# Patient Record
Sex: Female | Born: 1960
Health system: Southern US, Community
[De-identification: ages and names within clinical notes are randomized; demographics above are authoritative.]

## PROBLEM LIST (undated history)

## (undated) DIAGNOSIS — G4700 Insomnia, unspecified: Secondary | ICD-10-CM

## (undated) DIAGNOSIS — F319 Bipolar disorder, unspecified: Secondary | ICD-10-CM

## (undated) DIAGNOSIS — K5730 Diverticulosis of large intestine without perforation or abscess without bleeding: Secondary | ICD-10-CM

## (undated) HISTORY — PX: TUBAL LIGATION: SHX77

## (undated) HISTORY — DX: Diverticulosis of large intestine without perforation or abscess without bleeding: K57.30

## (undated) HISTORY — DX: Insomnia, unspecified: G47.00

## (undated) HISTORY — DX: Bipolar disorder, unspecified: F31.9

## (undated) HISTORY — PX: APPENDECTOMY: SHX54

---

## 1989-10-06 DIAGNOSIS — F319 Bipolar disorder, unspecified: Secondary | ICD-10-CM

## 1989-10-06 HISTORY — DX: Bipolar disorder, unspecified: F31.9

## 2002-12-13 ENCOUNTER — Emergency Department (HOSPITAL_COMMUNITY): Admission: EM | Admit: 2002-12-13 | Discharge: 2002-12-13 | Payer: Self-pay | Admitting: *Deleted

## 2003-04-04 ENCOUNTER — Emergency Department (HOSPITAL_COMMUNITY): Admission: EM | Admit: 2003-04-04 | Discharge: 2003-04-04 | Payer: Self-pay | Admitting: Emergency Medicine

## 2008-04-26 ENCOUNTER — Ambulatory Visit: Payer: Self-pay | Admitting: Gastroenterology

## 2008-04-26 DIAGNOSIS — K219 Gastro-esophageal reflux disease without esophagitis: Secondary | ICD-10-CM | POA: Insufficient documentation

## 2008-04-26 DIAGNOSIS — K644 Residual hemorrhoidal skin tags: Secondary | ICD-10-CM

## 2014-07-24 ENCOUNTER — Ambulatory Visit (INDEPENDENT_AMBULATORY_CARE_PROVIDER_SITE_OTHER): Payer: Self-pay | Admitting: Gastroenterology

## 2014-07-24 ENCOUNTER — Other Ambulatory Visit (INDEPENDENT_AMBULATORY_CARE_PROVIDER_SITE_OTHER): Payer: Self-pay

## 2014-07-24 ENCOUNTER — Encounter: Payer: Self-pay | Admitting: Gastroenterology

## 2014-07-24 VITALS — BP 124/82 | HR 76 | Ht 64.5 in | Wt 126.4 lb

## 2014-07-24 DIAGNOSIS — G8929 Other chronic pain: Secondary | ICD-10-CM

## 2014-07-24 DIAGNOSIS — R1013 Epigastric pain: Secondary | ICD-10-CM

## 2014-07-24 DIAGNOSIS — K625 Hemorrhage of anus and rectum: Secondary | ICD-10-CM

## 2014-07-24 DIAGNOSIS — K219 Gastro-esophageal reflux disease without esophagitis: Secondary | ICD-10-CM

## 2014-07-24 LAB — CBC WITH DIFFERENTIAL/PLATELET
BASOS ABS: 0 10*3/uL (ref 0.0–0.1)
BASOS PCT: 0.6 % (ref 0.0–3.0)
Eosinophils Absolute: 0.2 10*3/uL (ref 0.0–0.7)
Eosinophils Relative: 2.5 % (ref 0.0–5.0)
HCT: 45.4 % (ref 36.0–46.0)
Hemoglobin: 14.9 g/dL (ref 12.0–15.0)
Lymphocytes Relative: 29.9 % (ref 12.0–46.0)
Lymphs Abs: 2.2 10*3/uL (ref 0.7–4.0)
MCHC: 32.8 g/dL (ref 30.0–36.0)
MCV: 90.8 fl (ref 78.0–100.0)
MONO ABS: 0.5 10*3/uL (ref 0.1–1.0)
Monocytes Relative: 6.4 % (ref 3.0–12.0)
NEUTROS PCT: 60.6 % (ref 43.0–77.0)
Neutro Abs: 4.5 10*3/uL (ref 1.4–7.7)
PLATELETS: 232 10*3/uL (ref 150.0–400.0)
RBC: 5 Mil/uL (ref 3.87–5.11)
RDW: 13.6 % (ref 11.5–15.5)
WBC: 7.5 10*3/uL (ref 4.0–10.5)

## 2014-07-24 LAB — COMPREHENSIVE METABOLIC PANEL
ALT: 16 U/L (ref 0–35)
AST: 18 U/L (ref 0–37)
Albumin: 4.3 g/dL (ref 3.5–5.2)
Alkaline Phosphatase: 44 U/L (ref 39–117)
BUN: 26 mg/dL — AB (ref 6–23)
CO2: 22 mEq/L (ref 19–32)
CREATININE: 0.8 mg/dL (ref 0.4–1.2)
Calcium: 9.6 mg/dL (ref 8.4–10.5)
Chloride: 100 mEq/L (ref 96–112)
GFR: 78.04 mL/min (ref >60.00)
Glucose, Bld: 101 mg/dL — ABNORMAL HIGH (ref 70–99)
Potassium: 3.8 mEq/L (ref 3.5–5.1)
Sodium: 138 mEq/L (ref 135–145)
Total Bilirubin: 0.6 mg/dL (ref 0.2–1.2)
Total Protein: 8.2 g/dL (ref 6.0–8.3)

## 2014-07-24 MED ORDER — ESOMEPRAZOLE MAGNESIUM 40 MG PO CPDR: DELAYED_RELEASE_CAPSULE | ORAL | Status: DC

## 2014-07-24 NOTE — Progress Notes (Signed)
_                                                                                                                History of Present Illness:  [name] is a 53 year old white female self-referred for evaluation of abdominal pain and rectal bleeding.  She's had pyrosis for years for which she has taken over-the-counter Prevacid with partial relief.  Despite this she has frequent pyrosis, postprandial fullness with early satiety, and upper abdominal discomfort.  She had been taking Aleve daily but stopped this 3 weeks ago.  She's had occasional nausea with vomiting.  Paragraph patient also complains of very frequent rectal bleeding.  She may see blood mixed with her stools and frequently would lead to her underclothes.  She has hemorrhoids for which she is taking various topicals for months.  She moves her bowels regularly.  She has been taking hyomax with partial relief of abdominal pain.   History reviewed. No pertinent past medical history. Past Surgical History  Procedure Laterality Date  . Tubal ligation    . Appendectomy     family history includes Diabetes in her father; Heart attack in her mother; Lung cancer in her father. There is no history of Colon cancer, Colon polyps, or Kidney disease. Current Outpatient Prescriptions  Medication Sig Dispense Refill  . hydrocortisone (ANUSOL-HC) 25 MG suppository Place 25 mg rectally 2 (two) times daily.      . hyoscyamine (LEVBID) 0.375 MG 12 hr tablet Take 0.375 mg by mouth 2 (two) times daily.       No current facility-administered medications for this visit.   Allergies as of 07/24/2014 - Review Complete 07/24/2014  Allergen Reaction Noted  . Codeine Itching, Nausea Only, and Other (See Comments) 07/24/2014  . Penicillins Itching, Nausea Only, and Other (See Comments) 07/24/2014    reports that she quit smoking about 6 years ago. Her smoking use included Cigarettes. She smoked 0.00 packs per day. She has never used  smokeless tobacco. She reports that she drinks alcohol. She reports that she uses illicit drugs (Marijuana).   Review of Systems: Pertinent positive and negative review of systems were noted in the above HPI section. All other review of systems were otherwise negative.  Vital signs were reviewed in today's medical record Physical Exam: General: Well developed , well nourished, no acute distress Skin: anicteric Head: Normocephalic and atraumatic Eyes:  sclerae anicteric, EOMI Ears: Normal auditory acuity Mouth: No deformity or lesions Neck: Supple, no masses or thyromegaly Lungs: Clear throughout to auscultation Heart: Regular rate and rhythm; no murmurs, rubs or bruits Abdomen: Soft, non tender and non distended. No masses, hepatosplenomegaly or hernias noted. Normal Bowel sounds Rectal: Grade 3 hemorrhoids and skin tags Musculoskeletal: Symmetrical with no gross deformities  Skin: No lesions on visible extremities Pulses:  Normal pulses noted Extremities: No clubbing, cyanosis, edema or deformities noted Neurological: Alert oriented x 4, grossly nonfocal Cervical Nodes:  No significant cervical adenopathy Inguinal Nodes: No significant inguinal adenopathy Psychological:  Alert  and cooperative. Normal mood and affect  See Assessment and Plan under Problem List

## 2014-07-24 NOTE — Assessment & Plan Note (Signed)
A she clearly is having hemorrhoidal bleeding.  She has grade 3 hemorrhoids.  A more proximal colonic bleeding source should be ruled out as well.  Recommendations #1 colonoscopy #2 to consider band ligation of hemorrhoids pending results of colonoscopy

## 2014-07-24 NOTE — Assessment & Plan Note (Signed)
Persistent upper abdominal pain postprandially may be due to ulcer or nonulcer dyspepsia.  NSAID use may certainly be contributing.   Recommendations #1 avoid NSAIDs #2 Nexium #3 if symptoms do not improve in the next 2 weeks would proceed with upper endoscopy

## 2014-07-24 NOTE — Patient Instructions (Signed)
Hemorrhoids  Hemorrhoids are swollen veins around the rectum or anus. There are two types of hemorrhoids:    Internal hemorrhoids. These occur in the veins just inside the rectum. They may poke through to the outside and become irritated and painful.   External hemorrhoids. These occur in the veins outside the anus and can be felt as a painful swelling or hard lump near the anus.  CAUSES   Pregnancy.    Obesity.    Constipation or diarrhea.    Straining to have a bowel movement.    Sitting for long periods on the toilet.   Heavy lifting or other activity that caused you to strain.   Anal intercourse.  SYMPTOMS    Pain.    Anal itching or irritation.    Rectal bleeding.    Fecal leakage.    Anal swelling.    One or more lumps around the anus.   DIAGNOSIS   Your caregiver may be able to diagnose hemorrhoids by visual examination. Other examinations or tests that may be performed include:    Examination of the rectal area with a gloved hand (digital rectal exam).    Examination of anal canal using a small tube (scope).    A blood test if you have lost a significant amount of blood.   A test to look inside the colon (sigmoidoscopy or colonoscopy).  TREATMENT  Most hemorrhoids can be treated at home. However, if symptoms do not seem to be getting better or if you have a lot of rectal bleeding, your caregiver may perform a procedure to help make the hemorrhoids get smaller or remove them completely. Possible treatments include:    Placing a rubber band at the base of the hemorrhoid to cut off the circulation (rubber band ligation).    Injecting a chemical to shrink the hemorrhoid (sclerotherapy).    Using a tool to burn the hemorrhoid (infrared light therapy).    Surgically removing the hemorrhoid (hemorrhoidectomy).    Stapling the hemorrhoid to block blood flow to the tissue (hemorrhoid stapling).   HOME CARE INSTRUCTIONS    Eat foods with fiber, such as whole grains, beans,  nuts, fruits, and vegetables. Ask your doctor about taking products with added fiber in them (fibersupplements).   Increase fluid intake. Drink enough water and fluids to keep your urine clear or pale yellow.    Exercise regularly.    Go to the bathroom when you have the urge to have a bowel movement. Do not wait.    Avoid straining to have bowel movements.    Keep the anal area dry and clean. Use wet toilet paper or moist towelettes after a bowel movement.    Medicated creams and suppositories may be used or applied as directed.    Only take over-the-counter or prescription medicines as directed by your caregiver.    Take warm sitz baths for 15-20 minutes, 3-4 times a day to ease pain and discomfort.    Place ice packs on the hemorrhoids if they are tender and swollen. Using ice packs between sitz baths may be helpful.     Put ice in a plastic bag.     Place a towel between your skin and the bag.     Leave the ice on for 15-20 minutes, 3-4 times a day.    Do not use a donut-shaped pillow or sit on the toilet for long periods. This increases blood pooling and pain.   SEEK MEDICAL CARE IF:     Will watch your condition.  Will get help right away if you are not doing well or get worse. Document Released: 09/19/2000 Document Revised: 09/08/2012 Document Reviewed: 07/27/2012 [facility] Patient Information 2015 [place], [place]. This information is not intended to replace advice given to you by your health care provider. Make sure you discuss any questions you have with your health care provider.   You have been scheduled for a colonoscopy. Please follow written instructions given to you  at your visit today.  Please pick up your prep kit at the pharmacy within the next 1-3 days. If you use inhalers (even only as needed), please bring them with you on the day of your procedure. Your physician has requested that you go to www.startemmi.com and enter the access code given to you at your visit today. This web site gives a general overview about your procedure. However, you should still follow specific instructions given to you by our office regarding your preparation for the procedure.  Go to the basement for labs today Brochure for Hemorrhoidal banding given

## 2014-07-24 NOTE — Assessment & Plan Note (Signed)
Patient's very symptomatic with GERD.  This may also be responsible for upper abdominal pain.  Recommendations #1 antireflux measures #2 begin Nexium 40 mg daily #3 continue hyomax as needed #4 to consider upper endoscopy pending results of above

## 2014-07-24 NOTE — Addendum Note (Signed)
Addended by: Melvia HeapsKAPLAN, ROBERT D on: 07/24/2014 06:33 PM   Modules accepted: Orders

## 2014-08-21 ENCOUNTER — Telehealth: Payer: Self-pay | Admitting: *Deleted

## 2014-08-21 NOTE — Telephone Encounter (Signed)
===  View-only below this line===  ----- Message -----    From: Louis Meckelobert D Kaplan, MD    Sent: 08/21/2014   9:36 AM      To: Marlowe Kaysobin M Larnell Granlund, CMA  Please schedule 48 hour bravo.  Study with patient holding her acid reflux medications

## 2014-08-23 ENCOUNTER — Telehealth: Payer: Self-pay | Admitting: Gastroenterology

## 2014-08-23 ENCOUNTER — Other Ambulatory Visit: Payer: Self-pay

## 2014-08-23 MED ORDER — PANTOPRAZOLE SODIUM 40 MG PO TBEC: 40.0000 mg | DELAYED_RELEASE_TABLET | Freq: Two times a day (BID) | ORAL | Status: DC

## 2014-08-23 NOTE — Telephone Encounter (Signed)
Switch her to Protonix 40 mg before breakfast and dinner.  She can take antacids as needed.  Also would schedule her for upper endoscopy at the same time of her colonoscopy

## 2014-08-23 NOTE — Telephone Encounter (Signed)
Rx called to [facility] and left on the voicemail. Patient notified.

## 2014-08-23 NOTE — Telephone Encounter (Signed)
Patient advised.

## 2014-08-23 NOTE — Telephone Encounter (Signed)
Patient reports improvement on Nexium, but not complete relief of the symptoms. She would like to try something different. Want to change to a different PPI?

## 2014-08-25 NOTE — Telephone Encounter (Signed)
Dr Rogue JuryKaplan Loretta [facility]   She is not going to want to cancel this appointment

## 2014-08-28 NOTE — Telephone Encounter (Signed)
Dr Arlyce DiceKaplan Does she need an EGD for the BRAVO study that you ordered on her?  I was letting you know that she is already having an EGD/COLON in [facility] on 12/8

## 2014-08-29 NOTE — Telephone Encounter (Signed)
ok 

## 2014-08-29 NOTE — Telephone Encounter (Signed)
If she is still having symptoms despite PPI therapy she should be scheduled for a bravo pH study while holding meds for 24 hours.

## 2014-08-29 NOTE — Telephone Encounter (Signed)
Tried to contact this patient to schedule a [facility]  This requires that she be cancelled [facility] which she will continue with the colonoscopy in [facility] on 12/8 She will need to be rescheduled for [facility]  Unable to contact patient  Dr Arlyce DiceKaplan, Lorain ChildesFYI as of now I am unable to contact this patient to schedule the EGD with BRAVO, she is already scheduled for the EGD with Colon on 12/8 I will continue to try and contact her but will leave her on [facility] as scheduled for now

## 2014-09-05 DIAGNOSIS — K5730 Diverticulosis of large intestine without perforation or abscess without bleeding: Secondary | ICD-10-CM

## 2014-09-05 HISTORY — DX: Diverticulosis of large intestine without perforation or abscess without bleeding: K57.30

## 2014-09-12 ENCOUNTER — Encounter: Payer: Self-pay | Admitting: Gastroenterology

## 2014-09-12 ENCOUNTER — Ambulatory Visit (AMBULATORY_SURGERY_CENTER): Payer: Self-pay | Admitting: Gastroenterology

## 2014-09-12 VITALS — BP 122/84 | HR 62 | Temp 98.5°F | Resp 16 | Ht 64.5 in | Wt 126.0 lb

## 2014-09-12 DIAGNOSIS — K625 Hemorrhage of anus and rectum: Secondary | ICD-10-CM

## 2014-09-12 DIAGNOSIS — R1013 Epigastric pain: Secondary | ICD-10-CM

## 2014-09-12 DIAGNOSIS — K5730 Diverticulosis of large intestine without perforation or abscess without bleeding: Secondary | ICD-10-CM

## 2014-09-12 DIAGNOSIS — K643 Fourth degree hemorrhoids: Secondary | ICD-10-CM

## 2014-09-12 MED ORDER — SODIUM CHLORIDE 0.9 % IV SOLN: 500.0000 mL | INTRAVENOUS | Status: DC

## 2014-09-12 NOTE — Patient Instructions (Signed)
Discharge instructions given. Handouts on diverticulosis and hemorrhoids. Resume previous medications. YOU HAD AN ENDOSCOPIC PROCEDURE TODAY AT THE [facility]: Refer to the procedure report that was given to you for any specific questions about what was found during the examination.  If the procedure report does not answer your questions, please call your gastroenterologist to clarify.  If you requested that your care partner not be given the details of your procedure findings, then the procedure report has been included in a sealed envelope for you to review at your convenience later.  YOU SHOULD EXPECT: Some feelings of bloating in the abdomen. Passage of more gas than usual.  Walking can help get rid of the air that was put into your GI tract during the procedure and reduce the bloating. If you had a lower endoscopy (such as a colonoscopy or flexible sigmoidoscopy) you may notice spotting of blood in your stool or on the toilet paper. If you underwent a bowel prep for your procedure, then you may not have a normal bowel movement for a few days.  DIET: Your first meal following the procedure should be a light meal and then it is ok to progress to your normal diet.  A half-sandwich or bowl of soup is an example of a good first meal.  Heavy or fried foods are harder to digest and may make you feel nauseous or bloated.  Likewise meals heavy in dairy and vegetables can cause extra gas to form and this can also increase the bloating.  Drink plenty of fluids but you should avoid alcoholic beverages for 24 hours.  ACTIVITY: Your care partner should take you home directly after the procedure.  You should plan to take it easy, moving slowly for the rest of the day.  You can resume normal activity the day after the procedure however you should NOT DRIVE or use heavy machinery for 24 hours (because of the sedation medicines used during the test).    SYMPTOMS TO REPORT IMMEDIATELY: A  gastroenterologist can be reached at any hour.  During normal business hours, 8:30 AM to 5:00 PM Monday through Friday, call [phone].  After hours and on weekends, please call the GI answering service at [phone] who will take a message and have the physician on call contact you.   Following lower endoscopy (colonoscopy or flexible sigmoidoscopy):  Excessive amounts of blood in the stool  Significant tenderness or worsening of abdominal pains  Swelling of the abdomen that is new, acute  Fever of 100F or higher  Following upper endoscopy (EGD)  Vomiting of blood or coffee ground material  New chest pain or pain under the shoulder blades  Painful or persistently difficult swallowing  New shortness of breath  Fever of 100F or higher  Black, tarry-looking stools  FOLLOW UP: If any biopsies were taken you will be contacted by phone or by letter within the next 1-3 weeks.  Call your gastroenterologist if you have not heard about the biopsies in 3 weeks.  Our staff will call the home number listed on your records the next business day following your procedure to check on you and address any questions or concerns that you may have at that time regarding the information given to you following your procedure. This is a courtesy call and so if there is no answer at the home number and we have not heard from you through the emergency physician on call, we will assume that you have returned to your  regular daily activities without incident.  SIGNATURES/CONFIDENTIALITY: You and/or your care partner have signed paperwork which will be entered into your electronic medical record.  These signatures attest to the fact that that the information above on your After Visit Summary has been reviewed and is understood.  Full responsibility of the confidentiality of this discharge information lies with you and/or your care-partner.

## 2014-09-12 NOTE — Op Note (Signed)
 [facility] [address] [place], [id number]   ENDOSCOPY PROCEDURE REPORT  PATIENT: Loretta [name], Loretta [name]  MR#: [id number] BIRTHDATE: 1960-10-19 , 53  yrs. old GENDER: female ENDOSCOPIST: Louis Meckelobert [name] Kaplan, MD REFERRED BY: PROCEDURE DATE:  09/12/2014 PROCEDURE:  EGD, diagnostic ASA CLASS:     Class II INDICATIONS:  epigastric pain and heartburn. MEDICATIONS: Residual sedation present, Monitored anesthesia care, and Propofol 70 mg IV TOPICAL ANESTHETIC:  DESCRIPTION OF PROCEDURE: After the risks benefits and alternatives of the procedure were thoroughly explained, informed consent was obtained.  The LB [id number] endoscope was introduced through the mouth and advanced to the second portion of the duodenum , Without limitations.  The instrument was slowly withdrawn as the mucosa was fully examined.      EXAM: The esophagus and gastroesophageal junction were completely normal in appearance.  The stomach was entered and closely examined.The antrum, angularis, and lesser curvature were well visualized, including a retroflexed view of the cardia and fundus. The stomach wall was normally distensable.  The scope passed easily through the pylorus into the duodenum.  Retroflexed views revealed no abnormalities.     The scope was then withdrawn from the patient and the procedure completed.  COMPLICATIONS: There were no immediate complications.  ENDOSCOPIC IMPRESSION: Normal appearing esophagus and GE junction, the stomach was well visualized and normal in appearance, normal appearing duodenum  RECOMMENDATIONS: Trial of dexilant 60mg  1-2x day  REPEAT EXAM:  eSigned:  Louis Meckelobert [name] Kaplan, MD 09/12/2014 9:24 AM    CC:

## 2014-09-12 NOTE — Progress Notes (Signed)
A/ox3 pleased with MAC, report to Celia RN 

## 2014-09-13 ENCOUNTER — Telehealth: Payer: Self-pay

## 2014-09-13 NOTE — Op Note (Signed)
[facility] [address] [place], [id number]   COLONOSCOPY PROCEDURE REPORT  PATIENT: Loretta [name], [name]  MR#: [id number] BIRTHDATE: Sep 06, 1961 , 53  yrs. old GENDER: female ENDOSCOPIST: Louis Meckelobert D Swara Donze, MD REFERRED BY: PROCEDURE DATE:  09/12/2014 PROCEDURE:   Colonoscopy, diagnostic First Screening Colonoscopy - Avg.  risk and is 50 yrs.  old or older Yes.  Prior Negative Screening - Now for repeat screening. N/A  History of Adenoma - Now for follow-up colonoscopy & has been > or = to 3 yrs.  N/A  Polyps Removed Today? No.  Recommend repeat exam, <10 yrs? No. ASA CLASS:   Class II INDICATIONS:first colonoscopy. MEDICATIONS: Monitored anesthesia care and Propofol 330 mg IV  DESCRIPTION OF PROCEDURE:   After the risks benefits and alternatives of the procedure were thoroughly explained, informed consent was obtained.  The digital rectal exam revealed hemorrhoids, Grade IV and revealed several skin tags.   The LB [id number]  endoscope was introduced through the anus and advanced to the   . No adverse events experienced.   The quality of the prep was Suprep good  The instrument was then slowly withdrawn as the colon was fully examined.      COLON FINDINGS: Internal hemorrhoids were found.   There was mild diverticulosis noted in the descending colon.   The examination was otherwise normal.  Retroflexed views revealed no abnormalities. The time to cecum=3 minutes 45 seconds.  Withdrawal time=8 minutes 24 seconds.  The scope was withdrawn and the procedure completed. COMPLICATIONS: There were no immediate complications.  ENDOSCOPIC IMPRESSION: 1.   Internal hemorrhoids 2.   Mild diverticulosis was noted in the descending colon 3.   The examination was otherwise normal  RECOMMENDATIONS: Continue current colorectal screening recommendations for "routine risk" patients with a repeat colonoscopy in 10 years.  eSigned:  Louis Meckelobert D Jaydeen Darley, MD 09/12/2014 9:18  AM   cc:

## 2014-09-13 NOTE — Telephone Encounter (Signed)
  Follow up Call-  Call back number 09/12/2014  Post procedure Call Back phone  # [phone]  Permission to leave phone message Yes     Patient questions:  Do you have a fever, pain , or abdominal swelling? No. Pain Score  0 *  Have you tolerated food without any problems? Yes.    Have you been able to return to your normal activities? Yes.    Do you have any questions about your discharge instructions: Diet   No. Medications  No. Follow up visit  No.  Do you have questions or concerns about your Care? No.  Actions: * If pain score is 4 or above: No action needed, pain <4.

## 2016-05-29 ENCOUNTER — Ambulatory Visit (HOSPITAL_BASED_OUTPATIENT_CLINIC_OR_DEPARTMENT_OTHER): Payer: Self-pay | Admitting: Family Medicine

## 2016-05-29 ENCOUNTER — Encounter: Payer: Self-pay | Admitting: Family Medicine

## 2016-05-29 ENCOUNTER — Ambulatory Visit: Payer: Self-pay | Attending: Internal Medicine

## 2016-05-29 ENCOUNTER — Other Ambulatory Visit: Payer: Self-pay

## 2016-05-29 VITALS — BP 137/89 | HR 78 | Temp 98.5°F | Wt 135.6 lb

## 2016-05-29 DIAGNOSIS — Z124 Encounter for screening for malignant neoplasm of cervix: Secondary | ICD-10-CM | POA: Insufficient documentation

## 2016-05-29 DIAGNOSIS — G4700 Insomnia, unspecified: Secondary | ICD-10-CM | POA: Insufficient documentation

## 2016-05-29 DIAGNOSIS — K644 Residual hemorrhoidal skin tags: Secondary | ICD-10-CM

## 2016-05-29 DIAGNOSIS — K5792 Diverticulitis of intestine, part unspecified, without perforation or abscess without bleeding: Secondary | ICD-10-CM | POA: Insufficient documentation

## 2016-05-29 DIAGNOSIS — Z9851 Tubal ligation status: Secondary | ICD-10-CM | POA: Insufficient documentation

## 2016-05-29 DIAGNOSIS — K648 Other hemorrhoids: Secondary | ICD-10-CM | POA: Insufficient documentation

## 2016-05-29 DIAGNOSIS — F319 Bipolar disorder, unspecified: Secondary | ICD-10-CM | POA: Insufficient documentation

## 2016-05-29 DIAGNOSIS — Z87891 Personal history of nicotine dependence: Secondary | ICD-10-CM | POA: Insufficient documentation

## 2016-05-29 LAB — HEMOCCULT GUIAC POC 1CARD (OFFICE): Fecal Occult Blood, POC: POSITIVE — AB

## 2016-05-29 MED ORDER — HYDROCORTISONE ACE-PRAMOXINE 2.5-1 % RE CREA: 1.0000 "application " | TOPICAL_CREAM | Freq: Three times a day (TID) | RECTAL | 0 refills | Status: DC

## 2016-05-29 MED FILL — HYDROCORT-PRAMOXINE 2.5%-1%: 2.5-1 | 20 days supply | Qty: 30 | Fill #0

## 2016-05-29 NOTE — Patient Instructions (Addendum)
Loretta [name] was seen today for hemorrhoids.  Diagnoses and all orders for this visit:  Pap smear for cervical cancer screening -     Cytology - PAP  External hemorrhoids -     POCT occult blood stool -     hydrocortisone-pramoxine (ANALPRAM-HC) 2.5-1 % rectal cream; Place 1 application rectally 3 (three) times daily. -     Ambulatory referral to General Surgery   Please use analpram as needed. Stop of you develop skin sensitivity or sores  Ultimately the hemorrhoids piles need to be removed, I have placed a general surgery referral. We will work on getting you with a surgeon  You will be called with pap results  F/u in 8 weeks for hemorrhoids   Dr. Armen PickupFunches   Hemorrhoids Hemorrhoids are swollen veins around the rectum or anus. There are two types of hemorrhoids:   Internal hemorrhoids. These occur in the veins just inside the rectum. They may poke through to the outside and become irritated and painful.  External hemorrhoids. These occur in the veins outside the anus and can be felt as a painful swelling or hard lump near the anus. CAUSES  Pregnancy.   Obesity.   Constipation or diarrhea.   Straining to have a bowel movement.   Sitting for long periods on the toilet.  Heavy lifting or other activity that caused you to strain.  Anal intercourse. SYMPTOMS   Pain.   Anal itching or irritation.   Rectal bleeding.   Fecal leakage.   Anal swelling.   One or more lumps around the anus.  DIAGNOSIS  Your caregiver may be able to diagnose hemorrhoids by visual examination. Other examinations or tests that may be performed include:   Examination of the rectal area with a gloved hand (digital rectal exam).   Examination of anal canal using a small tube (scope).   A blood test if you have lost a significant amount of blood.  A test to look inside the colon (sigmoidoscopy or colonoscopy). TREATMENT Most hemorrhoids can be treated at home. However, if  symptoms do not seem to be getting better or if you have a lot of rectal bleeding, your caregiver may perform a procedure to help make the hemorrhoids get smaller or remove them completely. Possible treatments include:   Placing a rubber band at the base of the hemorrhoid to cut off the circulation (rubber band ligation).   Injecting a chemical to shrink the hemorrhoid (sclerotherapy).   Using a tool to burn the hemorrhoid (infrared light therapy).   Surgically removing the hemorrhoid (hemorrhoidectomy).   Stapling the hemorrhoid to block blood flow to the tissue (hemorrhoid stapling).  HOME CARE INSTRUCTIONS   Eat foods with fiber, such as whole grains, beans, nuts, fruits, and vegetables. Ask your doctor about taking products with added fiber in them (fibersupplements).  Increase fluid intake. Drink enough water and fluids to keep your urine clear or pale yellow.   Exercise regularly.   Go to the bathroom when you have the urge to have a bowel movement. Do not wait.   Avoid straining to have bowel movements.   Keep the anal area dry and clean. Use wet toilet paper or moist towelettes after a bowel movement.   Medicated creams and suppositories may be used or applied as directed.   Only take over-the-counter or prescription medicines as directed by your caregiver.   Take warm sitz baths for 15-20 minutes, 3-4 times a day to ease pain and discomfort.   Place  ice packs on the hemorrhoids if they are tender and swollen. Using ice packs between sitz baths may be helpful.   Put ice in a plastic bag.   Place a towel between your skin and the bag.   Leave the ice on for 15-20 minutes, 3-4 times a day.   Do not use a donut-shaped pillow or sit on the toilet for long periods. This increases blood pooling and pain.  SEEK MEDICAL CARE IF:  You have increasing pain and swelling that is not controlled by treatment or medicine.  You have uncontrolled bleeding.  You  have difficulty or you are unable to have a bowel movement.  You have pain or inflammation outside the area of the hemorrhoids. MAKE SURE YOU:  Understand these instructions.  Will watch your condition.  Will get help right away if you are not doing well or get worse.   This information is not intended to replace advice given to you by your health care provider. Make sure you discuss any questions you have with your health care provider.   Document Released: 09/19/2000 Document Revised: 09/08/2012 Document Reviewed: 07/27/2012 [employer] Interactive Patient Education [employer]

## 2016-05-29 NOTE — Progress Notes (Signed)
Subjective:  Patient ID: Loretta [name], female    DOB: Mar 26, 1961  Age: 55 y.o. MRN: [id number]  CC: Hemorrhoids   HPI Loretta [name] presents for   1. Hemorrhoids: for pat 5 + years. She has had lower abdominal pain, pain with bowel movements and blood in stool. She has a c-scope in 09/2014 that revealed diverticulosis and internal hemorrhoids. She uses OTC hemorrhoid cream and wipes with temporary relief. She denies constipation.   Past Medical History:  Diagnosis Date  . Bipolar affective (HCC)   . Diverticulosis of colon without diverticulitis 2015  . Insomnia    Past Surgical History:  Procedure Laterality Date  . APPENDECTOMY    . TUBAL LIGATION     Social History   Social History  . Marital status: Divorced    Spouse name: N/A  . Number of children: 5  . Years of education: N/A   Occupational History  . House cleaner [employer]   Social History Main Topics  . Smoking status: Former Smoker    Types: Cigarettes    Quit date: 02/05/2008  . Smokeless tobacco: Never Used  . Alcohol use Yes     Comment: Occassionally  . Drug use:     Types: Marijuana     Comment: Every day for insomnia; relax to sleep  . Sexual activity: Not on file   Other Topics Concern  . Not on file   Social History Narrative  . No narrative on file    Outpatient Medications Prior to Visit  Medication Sig Dispense Refill  . hydrocortisone (ANUSOL-HC) 25 MG suppository Place 25 mg rectally 2 (two) times daily.    . hyoscyamine (LEVBID) 0.375 MG 12 hr tablet Take 0.375 mg by mouth 2 (two) times daily.    . pantoprazole (PROTONIX) 40 MG tablet Take 1 tablet (40 mg total) by mouth 2 (two) times daily. (Patient not taking: Reported on 05/29/2016) 60 tablet 3   No facility-administered medications prior to visit.     ROS Review of Systems  Constitutional: Negative for chills and fever.  Eyes: Negative for visual disturbance.  Respiratory: Negative for shortness of breath.     Cardiovascular: Negative for chest pain.  Gastrointestinal: Positive for abdominal pain, blood in stool and rectal pain.  Musculoskeletal: Negative for arthralgias and back pain.  Skin: Negative for rash.  Allergic/Immunologic: Negative for immunocompromised state.  Hematological: Negative for adenopathy. Does not bruise/bleed easily.  Psychiatric/Behavioral: Negative for dysphoric mood and suicidal ideas.    Objective:  BP 137/89 (BP Location: Right Arm, Patient Position: Sitting, Cuff Size: Small)   Pulse 78   Temp 98.5 F (36.9 C) (Oral)   Wt 135 lb 9.6 oz (61.5 kg)   SpO2 97%   BMI 22.92 kg/m   BP/Weight 05/29/2016 09/12/2014 07/24/2014  Systolic BP 137 122 124  Diastolic BP 89 84 82  Wt. (Lbs) 135.6 126 126.38  BMI 22.92 21.3 21.36     Physical Exam  Constitutional: She appears well-developed and well-nourished. No distress.  Cardiovascular: Normal rate, regular rhythm, normal heart sounds and intact distal pulses.   Pulmonary/Chest: Effort normal and breath sounds normal.  Genitourinary: Uterus normal. Rectal exam shows external hemorrhoid, internal hemorrhoid, tenderness and guaiac positive stool.    Pelvic exam was performed with patient prone. There is no rash, tenderness or lesion on the right labia. There is no rash, tenderness or lesion on the left labia. Cervix exhibits no motion tenderness, no discharge and no friability. Vaginal  discharge (scant white vaginal discharge ) found.  Musculoskeletal: She exhibits no edema.  Lymphadenopathy:       Right: No inguinal adenopathy present.       Left: No inguinal adenopathy present.  Skin: Skin is warm and dry. No rash noted.     Assessment & Plan:  Loretta [name] was seen today for hemorrhoids.  Diagnoses and all orders for this visit:  Pap smear for cervical cancer screening -     Cytology - PAP  External hemorrhoids -     POCT occult blood stool -     Discontinue: hydrocortisone-pramoxine (ANALPRAM-HC) 2.5-1 %  rectal cream; Place 1 application rectally 3 (three) times daily. -     Ambulatory referral to General Surgery  Other orders -     Cervicovaginal ancillary only   There are no diagnoses linked to this encounter.  No orders of the defined types were placed in this encounter.   Follow-up: No Follow-up on file.   Dessa PhiJosalyn Cyenna Rebello MD

## 2016-06-02 LAB — CYTOLOGY - PAP

## 2016-06-03 ENCOUNTER — Other Ambulatory Visit: Payer: Self-pay | Admitting: Family Medicine

## 2016-06-03 LAB — CERVICOVAGINAL ANCILLARY ONLY: Wet Prep (BD Affirm): POSITIVE — AB

## 2016-06-03 MED ORDER — METRONIDAZOLE 0.75 % VA GEL: 1.0000 | Freq: Every day | VAGINAL | 0 refills | Status: DC

## 2016-06-04 ENCOUNTER — Telehealth: Payer: Self-pay

## 2016-06-04 ENCOUNTER — Encounter: Payer: Self-pay | Admitting: Family Medicine

## 2016-06-04 MED FILL — VANDAZOLE VAGINAL 0.75% GEL: 0.75 | 5 days supply | Qty: 70 | Fill #0

## 2016-06-04 NOTE — Telephone Encounter (Signed)
Pt was called on 8/30 and informed of her results and also informed that a script was sent to the pharmacy for her.

## 2016-06-04 NOTE — Telephone Encounter (Signed)
Pt was called on 8/30, pt did not answer the phone and no voicemail is set up. Will call again.

## 2016-06-20 ENCOUNTER — Ambulatory Visit: Payer: Self-pay | Attending: Family Medicine | Admitting: Family Medicine

## 2016-06-20 ENCOUNTER — Encounter: Payer: Self-pay | Admitting: Family Medicine

## 2016-06-20 VITALS — BP 126/81 | HR 61 | Temp 98.3°F | Ht 64.0 in | Wt 139.0 lb

## 2016-06-20 DIAGNOSIS — M129 Arthropathy, unspecified: Secondary | ICD-10-CM

## 2016-06-20 DIAGNOSIS — F319 Bipolar disorder, unspecified: Secondary | ICD-10-CM | POA: Insufficient documentation

## 2016-06-20 DIAGNOSIS — K648 Other hemorrhoids: Secondary | ICD-10-CM

## 2016-06-20 DIAGNOSIS — Z87891 Personal history of nicotine dependence: Secondary | ICD-10-CM | POA: Insufficient documentation

## 2016-06-20 DIAGNOSIS — M19049 Primary osteoarthritis, unspecified hand: Secondary | ICD-10-CM | POA: Insufficient documentation

## 2016-06-20 DIAGNOSIS — G8929 Other chronic pain: Secondary | ICD-10-CM | POA: Insufficient documentation

## 2016-06-20 DIAGNOSIS — R1013 Epigastric pain: Secondary | ICD-10-CM | POA: Insufficient documentation

## 2016-06-20 DIAGNOSIS — F3176 Bipolar disorder, in full remission, most recent episode depressed: Secondary | ICD-10-CM | POA: Insufficient documentation

## 2016-06-20 DIAGNOSIS — K644 Residual hemorrhoidal skin tags: Secondary | ICD-10-CM | POA: Insufficient documentation

## 2016-06-20 DIAGNOSIS — J302 Other seasonal allergic rhinitis: Secondary | ICD-10-CM | POA: Insufficient documentation

## 2016-06-20 MED ORDER — PANTOPRAZOLE SODIUM 40 MG PO TBEC: DELAYED_RELEASE_TABLET | ORAL | 3 refills | Status: DC

## 2016-06-20 MED ORDER — PANTOPRAZOLE SODIUM 40 MG PO TBEC: 40.0000 mg | DELAYED_RELEASE_TABLET | Freq: Every day | ORAL | 3 refills | Status: DC

## 2016-06-20 MED ORDER — DIAZEPAM 5 MG PO TABS: 5.0000 mg | ORAL_TABLET | Freq: Every day | ORAL | 1 refills | Status: DC

## 2016-06-20 MED ORDER — CETIRIZINE HCL 10 MG PO TABS: 10.0000 mg | ORAL_TABLET | Freq: Every day | ORAL | 11 refills | Status: DC

## 2016-06-20 MED ORDER — HYDROCORTISONE ACE-PRAMOXINE 2.5-1 % RE CREA: 1.0000 "application " | TOPICAL_CREAM | Freq: Three times a day (TID) | RECTAL | 5 refills | Status: DC

## 2016-06-20 MED ORDER — DICLOFENAC SODIUM 1 % TD GEL: 2.0000 g | Freq: Four times a day (QID) | TRANSDERMAL | 3 refills | Status: DC

## 2016-06-20 MED FILL — DICLOFENAC SODIUM 1% GEL: 1 | 30 days supply | Qty: 100 | Fill #0

## 2016-06-20 MED FILL — PANTOPRAZOLE SOD DR 40 MG T: 40 | 30 days supply | Qty: 30 | Fill #0

## 2016-06-20 MED FILL — HYDROCORT-PRAMOXINE 2.5%-1%: 2.5-1 | 30 days supply | Qty: 30 | Fill #0

## 2016-06-20 NOTE — Patient Instructions (Addendum)
Loretta [name] was seen today for abdominal pain.  Diagnoses and all orders for this visit:  Abdominal pain, chronic, epigastric -     Discontinue: pantoprazole (PROTONIX) 40 MG tablet; Take 1 tablet (40 mg total) by mouth daily. -     pantoprazole (PROTONIX) 40 MG tablet; Twice daily before meals for next 2 weeks, then once nightly  before supper -     Ambulatory referral to Gastroenterology  External hemorrhoids -     hydrocortisone-pramoxine (ANALPRAM-HC) 2.5-1 % rectal cream; Place 1 application rectally 3 (three) times daily.  Bipolar disorder, in full remission, most recent episode depressed (HCC) -     diazepam (VALIUM) 5 MG tablet; Take 1 tablet (5 mg total) by mouth at bedtime.  Seasonal allergies -     cetirizine (ZYRTEC) 10 MG tablet; Take 1 tablet (10 mg total) by mouth daily.  Hand arthritis -     diclofenac sodium (VOLTAREN) 1 % GEL; Apply 2 g topically 4 (four) times daily.    F/u in 4 weeks for abdominal pain   Dr. Armen PickupFunches

## 2016-06-20 NOTE — Progress Notes (Signed)
Subjective:  Patient ID: Loretta [name], female    DOB: Apr 22, 1961  Age: 55 y.o. MRN: [id number]  CC: Abdominal Pain   HPI Loretta [name] has hx of bipolar depression, chronic abdominal pain, hemorrhoids she presents for    1. Chronic abdominal pain: for past 8 years. Stomach gets hard, has abdominal cramping. She had normal EGD in 2015. She has done OTC nexium, prilosec without relief.  Eats as very bland diet, oatmeal, protein shakes. No emesis. A lot of reflux. No fever.   2. Bipolar depression: she was diagnosed in her 30s. She was previously treated with zoloft, remeron and valium. She admits to feeling stressed related to her stomach sickness and insomnia.   3. Hemorrhoids: stable. Anusol HC helps. Requesting refill. Awaiting gen surg appt.   Social History  Substance Use Topics  . Smoking status: Former Smoker    Types: Cigarettes    Quit date: 02/05/2008  . Smokeless tobacco: Never Used  . Alcohol use Yes     Comment: Occassionally    Outpatient Medications Prior to Visit  Medication Sig Dispense Refill  . hydrocortisone-pramoxine (ANALPRAM-HC) 2.5-1 % rectal cream Place 1 application rectally 3 (three) times daily. 30 g 0  . metroNIDAZOLE (METROGEL VAGINAL) 0.75 % vaginal gel Place 1 Applicatorful vaginally at bedtime. (Patient not taking: Reported on 06/20/2016) 70 g 0   No facility-administered medications prior to visit.     ROS Review of Systems  Constitutional: Negative for chills and fever.  Eyes: Negative for visual disturbance.  Respiratory: Negative for shortness of breath.   Cardiovascular: Negative for chest pain.  Gastrointestinal: Positive for abdominal pain and rectal pain. Negative for blood in stool.  Musculoskeletal: Negative for arthralgias and back pain.  Skin: Negative for rash.  Allergic/Immunologic: Negative for immunocompromised state.  Hematological: Negative for adenopathy. Does not bruise/bleed easily.  Psychiatric/Behavioral: Positive  for sleep disturbance. Negative for dysphoric mood and suicidal ideas.    Objective:  BP 126/81 (BP Location: Right Arm, Patient Position: Sitting, Cuff Size: Small)   Pulse 61   Temp 98.3 F (36.8 C) (Oral)   Ht 5\' 4"  (1.626 m)   Wt 139 lb (63 kg)   SpO2 98%   BMI 23.86 kg/m   BP/Weight 06/20/2016 05/29/2016 09/12/2014  Systolic BP 126 137 122  Diastolic BP 81 89 84  Wt. (Lbs) 139 135.6 126  BMI 23.86 22.92 21.3    Physical Exam  Constitutional: She is oriented to person, place, and time. She appears well-developed and well-nourished. No distress.  HENT:  Head: Normocephalic and atraumatic.  Cardiovascular: Normal rate, regular rhythm, normal heart sounds and intact distal pulses.   Pulmonary/Chest: Effort normal and breath sounds normal.  Abdominal: Soft. Bowel sounds are normal. She exhibits no distension. There is no tenderness. There is no rebound and no guarding.  Musculoskeletal: She exhibits no edema.  Neurological: She is alert and oriented to person, place, and time.  Skin: Skin is warm and dry. No rash noted.  Psychiatric: She exhibits a depressed mood.  Tearful during exam      Assessment & Plan:  [name] was seen today for abdominal pain.  Diagnoses and all orders for this visit:  Abdominal pain, chronic, epigastric -     Discontinue: pantoprazole (PROTONIX) 40 MG tablet; Take 1 tablet (40 mg total) by mouth daily. -     pantoprazole (PROTONIX) 40 MG tablet; Twice daily before meals for next 2 weeks, then once nightly  before supper -  Ambulatory referral to Gastroenterology  External hemorrhoids -     hydrocortisone-pramoxine (ANALPRAM-HC) 2.5-1 % rectal cream; Place 1 application rectally 3 (three) times daily.  Bipolar disorder, in full remission, most recent episode depressed (HCC) -     diazepam (VALIUM) 5 MG tablet; Take 1 tablet (5 mg total) by mouth at bedtime.  Seasonal allergies -     cetirizine (ZYRTEC) 10 MG tablet; Take 1 tablet (10 mg  total) by mouth daily.  Hand arthritis -     diclofenac sodium (VOLTAREN) 1 % GEL; Apply 2 g topically 4 (four) times daily.   There are no diagnoses linked to this encounter.  No orders of the defined types were placed in this encounter.   Follow-up: Return in about 4 weeks (around 07/18/2016) for chronic abdominal pain .   Dessa PhiJosalyn Ashvin Adelson MD

## 2016-06-20 NOTE — Assessment & Plan Note (Signed)
[facility]

## 2016-06-20 NOTE — Assessment & Plan Note (Signed)
Restart valium given significant sleep disturbance Advised patient re-establish with mental health

## 2016-06-20 NOTE — Assessment & Plan Note (Signed)
Chronic pain possible IBS  Plan for PPI Consider bentyl  GI referral placed

## 2016-06-27 MED FILL — ANALPRAM HC 2.5%-1% CRM SIN: 2.5-1 | 10 days supply | Qty: 30 | Fill #1

## 2016-07-04 MED FILL — PANTOPRAZOLE SOD DR 40 MG T: 40 | 30 days supply | Qty: 60 | Fill #0

## 2016-07-08 MED FILL — HYDROCORT-PRAMOXINE 2.5%-1%: 2.5-1 | 10 days supply | Qty: 30 | Fill #2

## 2016-07-10 ENCOUNTER — Ambulatory Visit: Payer: Self-pay | Attending: Family Medicine | Admitting: Physician Assistant

## 2016-07-10 VITALS — BP 132/85 | HR 77 | Temp 98.6°F | Resp 16 | Wt 140.4 lb

## 2016-07-10 DIAGNOSIS — Z23 Encounter for immunization: Secondary | ICD-10-CM

## 2016-07-10 DIAGNOSIS — A281 Cat-scratch disease: Secondary | ICD-10-CM | POA: Insufficient documentation

## 2016-07-10 DIAGNOSIS — W5503XA Scratched by cat, initial encounter: Secondary | ICD-10-CM

## 2016-07-10 DIAGNOSIS — R110 Nausea: Secondary | ICD-10-CM | POA: Insufficient documentation

## 2016-07-10 DIAGNOSIS — Z880 Allergy status to penicillin: Secondary | ICD-10-CM | POA: Insufficient documentation

## 2016-07-10 DIAGNOSIS — Z79899 Other long term (current) drug therapy: Secondary | ICD-10-CM | POA: Insufficient documentation

## 2016-07-10 LAB — CBC WITH DIFFERENTIAL/PLATELET
BASOS ABS: 0 {cells}/uL (ref 0–200)
Basophils Relative: 0 %
EOS PCT: 2 %
Eosinophils Absolute: 168 cells/uL (ref 15–500)
HCT: 41 % (ref 35.0–45.0)
Hemoglobin: 13.4 g/dL (ref 11.7–15.5)
Lymphocytes Relative: 29 %
Lymphs Abs: 2436 cells/uL (ref 850–3900)
MCH: 29 pg (ref 27.0–33.0)
MCHC: 32.7 g/dL (ref 32.0–36.0)
MCV: 88.7 fL (ref 80.0–100.0)
MONOS PCT: 9 %
MPV: 10.6 fL (ref 7.5–12.5)
Monocytes Absolute: 756 cells/uL (ref 200–950)
NEUTROS PCT: 60 %
Neutro Abs: 5040 cells/uL (ref 1500–7800)
PLATELETS: 270 10*3/uL (ref 140–400)
RBC: 4.62 MIL/uL (ref 3.80–5.10)
RDW: 13.4 % (ref 11.0–15.0)
WBC: 8.4 10*3/uL (ref 3.8–10.8)

## 2016-07-10 MED ORDER — AZITHROMYCIN 250 MG PO TABS: ORAL_TABLET | ORAL | 0 refills | Status: DC

## 2016-07-10 MED ORDER — ONDANSETRON 4 MG PO TBDP: 4.0000 mg | ORAL_TABLET | Freq: Three times a day (TID) | ORAL | 0 refills | Status: DC | PRN

## 2016-07-10 MED FILL — AZITHROMYCIN 250 MG TABLET: 250 | 6 days supply | Qty: 6 | Fill #0

## 2016-07-10 MED FILL — ONDANSETRON ODT 4 MG TABLET: 4 | 6 days supply | Qty: 20 | Fill #0

## 2016-07-10 NOTE — Progress Notes (Signed)
Loretta [name], is a 55 y.o. female  [id number]:[id number]  NFA:[id number]  DOB - October 13, 1960  Subjective:  Chief Complaint and HPI: Loretta [name] is a 55 y.o. female here today for pain under her R arm.  She has a colony of ferrel cats.  She thinks one may have scratched her about 3-4 weeks ago on the palm of her R hand.  This area now has a pustule.  She has felt poorly the last couple of weeks.  She had a mild ST for a few days and mild intermittent HA and nausea.  About 1 week ago her L axillary area started getting tender and has started feeling worse over the last few days.  She also c/o fatigue and chills.  She has not checked to see if she has had a fever.  She has not taken anything for her s/sx. Pain is not in a dermatomal distribution.    ROS:   Constitutional:  No f/c, No night sweats, No unexplained weight loss.  +fatigue EENT:  No vision changes, No blurry vision, No hearing changes. No mouth, throat, or ear problems.  Respiratory: No cough, No SOB Cardiac: No CP, no palpitations GI:  No abd pain, +nausea, no V/D. GU: No Urinary s/sx Musculoskeletal: No joint pain, there is pain on the underside of her R arm Neuro: Mild headache, no dizziness, no motor weakness.  Skin: No rash Endocrine:  No polydipsia. No polyuria.  Psych: Denies SI/HI  No problems updated.  ALLERGIES: Allergies  Allergen Reactions  . Codeine Itching, Nausea Only and Other (See Comments)    Pt feels like skin is crawling  . Penicillins Itching, Nausea Only and Other (See Comments)    Pt feels like skin is crawling    PAST MEDICAL HISTORY: Past Medical History:  Diagnosis Date  . Bipolar affective (HCC)   . Diverticulosis of colon without diverticulitis 09/2014  . Insomnia     MEDICATIONS AT HOME: Prior to Admission medications   Medication Sig Start Date End Date Taking? Authorizing Provider  azithromycin (ZITHROMAX) 250 MG tablet Take 2 today then 1 daily 07/10/16   Anders SimmondsAngela M Riham Polyakov, PA-C    cetirizine (ZYRTEC) 10 MG tablet Take 1 tablet (10 mg total) by mouth daily. 06/20/16   Josalyn Funches, MD  diazepam (VALIUM) 5 MG tablet Take 1 tablet (5 mg total) by mouth at bedtime. 06/20/16   Josalyn Funches, MD  diclofenac sodium (VOLTAREN) 1 % GEL Apply 2 g topically 4 (four) times daily. 06/20/16   Josalyn Funches, MD  hydrocortisone-pramoxine (ANALPRAM-HC) 2.5-1 % rectal cream Place 1 application rectally 3 (three) times daily. 06/20/16   Josalyn Funches, MD  ondansetron (ZOFRAN-ODT) 4 MG disintegrating tablet Take 1 tablet (4 mg total) by mouth every 8 (eight) hours as needed for nausea or vomiting. 07/10/16   Anders SimmondsAngela M Lamarion Mcevers, PA-C  pantoprazole (PROTONIX) 40 MG tablet Twice daily before meals for next 2 weeks, then once nightly  before supper 06/20/16   Dessa PhiJosalyn Funches, MD     Objective:  EXAM:   Vitals:   07/10/16 1025  BP: 132/85  Pulse: 77  Resp: 16  Temp: 98.6 F (37 C)  TempSrc: Oral  SpO2: 98%  Weight: 140 lb 6.4 oz (63.7 kg)    General appearance : A&OX3. NAD. Non-toxic-appearing HEENT: Atraumatic and Normocephalic.  PERRLA. EOM intact.  TM clear B. Mouth-MMM, post pharynx WNL w/o erythema, No PND. Neck: supple, no JVD. No cervical lymphadenopathy. No thyromegaly Chest/Lungs:  Breathing-non-labored, Good air  entry bilaterally, breath sounds normal without rales, rhonchi, or wheezing  CVS: S1 S2 regular, no murmurs, gallops, rubs  Extremities: Bilateral Lower Ext shows no edema, both legs are warm to touch with = pulse throughout. Palm R hand <1cm erythematous and indurated area that is slightly tender and pustular center(area that cat scratched).  Minimal central fluctuance.  There is no streaking of the RUE.  There is general swelling in the L axilla w/o discreet LN.  Minimal erythema of the axilla.   Neurology:  CN II-XII grossly intact, Non focal.   Psych:  TP linear. J/I WNL. Normal speech. Appropriate eye contact and affect.  Skin:  No Rash  Data Review No  results found for: HGBA1C   Assessment & Plan   1. Cat scratch with classic presentation of cat scratch fever - Tdap vaccine greater than or equal to 7yo IM - CBC with Differential/Platelet Per Up To Date guidelines-- azithromycin (ZITHROMAX) 250 MG tablet; Take 2 today then 1 daily  Dispense: 6 tablet; Refill: 0  2. Nausea without vomiting-secondary to #1  - ondansetron (ZOFRAN-ODT) 4 MG disintegrating tablet; Take 1 tablet (4 mg total) by mouth every 8 (eight) hours as needed for nausea or vomiting.  Dispense: 20 tablet; Refill: 0        Patient have been counseled extensively about nutrition and exercise  Return in about 1 week (around 07/17/2016) for with me for f/up cat scratch fever.  The patient was given clear instructions to go to ER or return to medical center if symptoms don't improve, worsen or new problems develop. The patient verbalized understanding. The patient was told to call to get lab results if they haven't heard anything in the next week.     Georgian Co, PA-C [facility] [place], [place] [phone]   07/10/2016, 11:01 AMPatient ID: Loretta [name], female   DOB: [birth year], 55 y.o.   MRN: [id number]

## 2016-07-10 NOTE — Patient Instructions (Addendum)
Tdap Vaccine (Tetanus, Diphtheria and Pertussis): What You Need to Know 1. Why get vaccinated? Tetanus, diphtheria and pertussis are very serious diseases. Tdap vaccine can protect us from these diseases. And, Tdap vaccine given to pregnant women can protect newborn babies against pertussis. TETANUS (Lockjaw) is rare in the [place] today. It causes painful muscle tightening and stiffness, usually all over the body.  It can lead to tightening of muscles in the head and neck so you can't open your mouth, swallow, or sometimes even breathe. Tetanus kills about 1 out of 10 people who are infected even after receiving the best medical care. DIPHTHERIA is also rare in the [place] today. It can cause a thick coating to form in the back of the throat.  It can lead to breathing problems, heart failure, paralysis, and death. PERTUSSIS (Whooping Cough) causes severe coughing spells, which can cause difficulty breathing, vomiting and disturbed sleep.  It can also lead to weight loss, incontinence, and rib fractures. Up to 2 in 100 adolescents and 5 in 100 adults with pertussis are hospitalized or have complications, which could include pneumonia or death. These diseases are caused by bacteria. Diphtheria and pertussis are spread from person to person through secretions from coughing or sneezing. Tetanus enters the body through cuts, scratches, or wounds. Before vaccines, as many as 200,000 cases of diphtheria, 200,000 cases of pertussis, and hundreds of cases of tetanus, were reported in the [place] each year. Since vaccination began, reports of cases for tetanus and diphtheria have dropped by about 99% and for pertussis by about 80%. 2. Tdap vaccine Tdap vaccine can protect adolescents and adults from tetanus, diphtheria, and pertussis. One dose of Tdap is routinely given at age 11 or 12. People who did not get Tdap at that age should get it as soon as possible. Tdap is especially important  for healthcare professionals and anyone having close contact with a baby younger than 12 months. Pregnant women should get a dose of Tdap during every pregnancy, to protect the newborn from pertussis. Infants are most at risk for severe, life-threatening complications from pertussis. Another vaccine, called Td, protects against tetanus and diphtheria, but not pertussis. A Td booster should be given every 10 years. Tdap may be given as one of these boosters if you have never gotten Tdap before. Tdap may also be given after a severe cut or burn to prevent tetanus infection. Your doctor or the person giving you the vaccine can give you more information. Tdap may safely be given at the same time as other vaccines. 3. Some people should not get this vaccine  A person who has ever had a life-threatening allergic reaction after a previous dose of any diphtheria, tetanus or pertussis containing vaccine, OR has a severe allergy to any part of this vaccine, should not get Tdap vaccine. Tell the person giving the vaccine about any severe allergies.  Anyone who had coma or long repeated seizures within 7 days after a childhood dose of DTP or DTaP, or a previous dose of Tdap, should not get Tdap, unless a cause other than the vaccine was found. They can still get Td.  Talk to your doctor if you:  have seizures or another nervous system problem,  had severe pain or swelling after any vaccine containing diphtheria, tetanus or pertussis,  ever had a condition called Guillain-Barr Syndrome (GBS),  aren't feeling well on the day the shot is scheduled. 4. Risks With any medicine, including vaccines, there is   a chance of side effects. These are usually mild and go away on their own. Serious reactions are also possible but are rare. Most people who get Tdap vaccine do not have any problems with it. Mild problems following Tdap (Did not interfere with activities)  Pain where the shot was given (about 3 in 4  adolescents or 2 in 3 adults)  Redness or swelling where the shot was given (about 1 person in 5)  Mild fever of at least 100.4F (up to about 1 in 25 adolescents or 1 in 100 adults)  Headache (about 3 or 4 people in 10)  Tiredness (about 1 person in 3 or 4)  Nausea, vomiting, diarrhea, stomach ache (up to 1 in 4 adolescents or 1 in 10 adults)  Chills, sore joints (about 1 person in 10)  Body aches (about 1 person in 3 or 4)  Rash, swollen glands (uncommon) Moderate problems following Tdap (Interfered with activities, but did not require medical attention)  Pain where the shot was given (up to 1 in 5 or 6)  Redness or swelling where the shot was given (up to about 1 in 16 adolescents or 1 in 12 adults)  Fever over 102F (about 1 in 100 adolescents or 1 in 250 adults)  Headache (about 1 in 7 adolescents or 1 in 10 adults)  Nausea, vomiting, diarrhea, stomach ache (up to 1 or 3 people in 100)  Swelling of the entire arm where the shot was given (up to about 1 in 500). Severe problems following Tdap (Unable to perform usual activities; required medical attention)  Swelling, severe pain, bleeding and redness in the arm where the shot was given (rare). Problems that could happen after any vaccine:  People sometimes faint after a medical procedure, including vaccination. Sitting or lying down for about 15 minutes can help prevent fainting, and injuries caused by a fall. Tell your doctor if you feel dizzy, or have vision changes or ringing in the ears.  Some people get severe pain in the shoulder and have difficulty moving the arm where a shot was given. This happens very rarely.  Any medication can cause a severe allergic reaction. Such reactions from a vaccine are very rare, estimated at fewer than 1 in a million doses, and would happen within a few minutes to a few hours after the vaccination. As with any medicine, there is a very remote chance of a vaccine causing a serious  injury or death. The safety of vaccines is always being monitored. For more information, visit: www.cdc.gov/vaccinesafety/ 5. What if there is a serious problem? What should I look for?  Look for anything that concerns you, such as signs of a severe allergic reaction, very high fever, or unusual behavior.  Signs of a severe allergic reaction can include hives, swelling of the face and throat, difficulty breathing, a fast heartbeat, dizziness, and weakness. These would usually start a few minutes to a few hours after the vaccination. What should I do?  If you think it is a severe allergic reaction or other emergency that can't wait, call 9-1-1 or get the person to the nearest hospital. Otherwise, call your doctor.  Afterward, the reaction should be reported to the Vaccine Adverse Event Reporting System (VAERS). Your doctor might file this report, or you can do it yourself through the VAERS web site at www.vaers.hhs.gov, or by calling 1-800-822-7967. VAERS does not give medical advice.  6. The National Vaccine Injury Compensation Program The National Vaccine Injury Compensation Program (  VICP) is a federal program that was created to compensate people who may have been injured by certain vaccines. Persons who believe they may have been injured by a vaccine can learn about the program and about filing a claim by calling 1-(631)696-3303 or visiting the VICP website at SpiritualWord.atwww.hrsa.gov/vaccinecompensation. There is a time limit to file a claim for compensation. 7. How can I learn more?  Ask your doctor. He or she can give you the vaccine package insert or suggest other sources of information.  Call your local or state health department.  Contact the Centers for Disease Control and Prevention (CDC):  Call (409) 518-38891-772-081-3411 (1-800-CDC-INFO) or  Visit CDC's website at PicCapture.uywww.cdc.gov/vaccines CDC Tdap Vaccine VIS (11/29/13)   This information is not intended to replace advice given to you by your health care  provider. Make sure you discuss any questions you have with your health care provider.   Document Released: 03/23/2012 Document Revised: 10/13/2014 Document Reviewed: 01/04/2014 Elsevier Interactive Patient Education 2016 Else[employer] Inc.  Cat-Scratch Disease Cat-scratch disease is a rare infection that can be passed to people through the scratch or bite of an infected cat. The infection causes a red bump at the site of the bite or scratch. It may also cause swollen lymph glands and other symptoms. In most cases, the infection is mild and does not cause serious problems. However, a more severe infection can develop in people who have other illnesses or problems that weaken their body's defense system (immune system).  CAUSES This condition is caused by a type of bacteria called Bartonella henselae. These bacteria are present in the mouth or on the claws of cats. SYMPTOMS Common symptoms of this condition include:  A red and sore pimple or bump--with or without pus--on the skin where the cat scratched or bit. The pimple or sore may be present for as long as 3 weeks after the scratch or bite occurred.  One or more enlarged lymph glands located toward the center of the body from where the injury occurred. Other symptoms include:  Low-grade fever.  Tiredness and fatigue.  Headache.  Sore throat. DIAGNOSIS This condition may be diagnosed based on your symptoms and history of a scratch or bite from a cat. Your health care provider will examine the skin sore and look for swollen lymph glands. You may also have tests, such as:  Culture tests of any fluid or pus from the injury site.  Blood tests.  Removal of a tissue sample from a swollen lymph gland (biopsy) to be looked at under a microscope. This may be done to confirm the diagnosis and to make sure that a different infection or disease is not causing your illness. TREATMENT If the condition is mild, treatment may not be needed. You may  be advised to take pain medicine and apply heat to the affected area. A more severe infection can be treated with antibiotic medicine. People who have immune system problems will usually be treated with antibiotics because they are at risk for developing a severe infection. These include people who have HIV or AIDS, people who take medicines that may modify their immune system, and people who have had an organ transplant. HOME CARE INSTRUCTIONS  Rest until you feel better.  Take over-the-counter and prescription medicines only as told by your health care provider.  If you were prescribed an antibiotic medicine, take it as told by your health care provider. Do not stop taking the antibiotic even if you start to feel better.  Keep  the area of the cat scratch clean. Wash it with soap and water, or apply an antiseptic solution such as povidone-iodine.  If directed, apply heat to the scratch area:  Put a towel between your skin and a heat pack or heating pad.  Leave the heat on for 20-30 minutes or as told by your health care provider. PREVENTION  Avoid injury while playing with cats.  Wash well after playing with cats.  Do not let your cat lick sores on your body.  Do not let your cat roam around outside of your house. SEEK MEDICAL CARE IF:  You have increased redness, swelling, or pain at the site of the scratch.  You have fluid, blood, or pus coming from the scratch area.  You have a fever. SEEK IMMEDIATE MEDICAL CARE IF:  You have increased swelling of your lymph glands.  You develop pain in your abdomen.  You develop a skin rash.  You feel dizzy or you pass out.  You have a worsening headache.  You develop inflammation of your eye or have increasing vision problems.  You have pain in one of your bones.  You develop a stiff neck.   This information is not intended to replace advice given to you by your health care provider. Make sure you discuss any questions you  have with your health care provider.   Document Released: 09/19/2000 Document Revised: 06/13/2015 Document Reviewed: 12/26/2014 Elsevier Interactive Patient Education [employer]

## 2016-07-10 NOTE — Progress Notes (Signed)
Pt is in the office today for swelling under the right arm

## 2016-07-11 ENCOUNTER — Telehealth: Payer: Self-pay

## 2016-07-11 NOTE — Telephone Encounter (Signed)
Contacted pt to go over lab results pt is aware of results and states she is feeling better since on antibiotics and doesn't have any questions or concerns

## 2016-07-23 ENCOUNTER — Ambulatory Visit: Payer: Self-pay | Attending: Family Medicine | Admitting: Family Medicine

## 2016-07-23 ENCOUNTER — Encounter: Payer: Self-pay | Admitting: Family Medicine

## 2016-07-23 VITALS — BP 120/80 | HR 77 | Temp 98.1°F | Ht 64.0 in | Wt 141.0 lb

## 2016-07-23 DIAGNOSIS — Z87891 Personal history of nicotine dependence: Secondary | ICD-10-CM | POA: Insufficient documentation

## 2016-07-23 DIAGNOSIS — L03115 Cellulitis of right lower limb: Secondary | ICD-10-CM | POA: Insufficient documentation

## 2016-07-23 LAB — CBC
HCT: 38.8 % (ref 35.0–45.0)
Hemoglobin: 13.1 g/dL (ref 11.7–15.5)
MCH: 29.3 pg (ref 27.0–33.0)
MCHC: 33.8 g/dL (ref 32.0–36.0)
MCV: 86.8 fL (ref 80.0–100.0)
MPV: 10 fL (ref 7.5–12.5)
PLATELETS: 336 10*3/uL (ref 140–400)
RBC: 4.47 MIL/uL (ref 3.80–5.10)
RDW: 12.5 % (ref 11.0–15.0)
WBC: 10.9 10*3/uL — AB (ref 3.8–10.8)

## 2016-07-23 LAB — COMPLETE METABOLIC PANEL WITH GFR
ALT: 13 U/L (ref 6–29)
AST: 13 U/L (ref 10–35)
Albumin: 3.9 g/dL (ref 3.6–5.1)
Alkaline Phosphatase: 54 U/L (ref 33–130)
BUN: 30 mg/dL — AB (ref 7–25)
CHLORIDE: 102 mmol/L (ref 98–110)
CO2: 25 mmol/L (ref 20–31)
CREATININE: 0.73 mg/dL (ref 0.50–1.05)
Calcium: 9.2 mg/dL (ref 8.6–10.4)
GFR, Est Non African American: >89 mL/min (ref >=60)
Glucose, Bld: 93 mg/dL (ref 65–99)
Potassium: 4.3 mmol/L (ref 3.5–5.3)
Sodium: 139 mmol/L (ref 135–146)
Total Bilirubin: 0.3 mg/dL (ref 0.2–1.2)
Total Protein: 7.5 g/dL (ref 6.1–8.1)

## 2016-07-23 LAB — URIC ACID: Uric Acid, Serum: 2.6 mg/dL (ref 2.5–7.0)

## 2016-07-23 MED ORDER — SULFAMETHOXAZOLE-TRIMETHOPRIM 800-160 MG PO TABS: 1.0000 | ORAL_TABLET | Freq: Two times a day (BID) | ORAL | 0 refills | Status: DC

## 2016-07-23 MED FILL — SULFAMETHOXAZOLE-TMP DS TAB: 800-160 | 10 days supply | Qty: 20 | Fill #0

## 2016-07-23 NOTE — Assessment & Plan Note (Signed)
A: exam is consistent with cellulitis. She denies injury to suggest fracture. She endorses systemic symptoms but fortunately her vital signs are normal.  P: CBC, CMP, uric acid Blood cx given report of fever and chills.  Bactrim  DS  Reviewed s/s to prompt ED evaluation  Close f/u in 2 days

## 2016-07-23 NOTE — Patient Instructions (Addendum)
Loretta [name] was seen today for ankle pain.  Diagnoses and all orders for this visit:  Cellulitis of right ankle -     CBC -     COMPLETE METABOLIC PANEL WITH GFR -     Culture, blood (single) w Reflex to ID Panel -     sulfamethoxazole-trimethoprim (BACTRIM DS,SEPTRA DS) 800-160 MG tablet; Take 1 tablet by mouth 2 (two) times daily. -     Uric acid   Continue to elevate, ice and compress right ankle Continue ibuprofen  Go to ED if you have worsening pain, swelling, redness   Follow up  on Friday October 20 at 9 AM, please arrive 15 mins early  Dr. Armen PickupFunches    Cellulitis Cellulitis is an infection of the skin and the tissue beneath it. The infected area is usually red and tender. Cellulitis occurs most often in the arms and lower legs.  CAUSES  Cellulitis is caused by bacteria that enter the skin through cracks or cuts in the skin. The most common types of bacteria that cause cellulitis are staphylococci and streptococci. SIGNS AND SYMPTOMS   Redness and warmth.  Swelling.  Tenderness or pain.  Fever. DIAGNOSIS  Your health care provider can usually determine what is wrong based on a physical exam. Blood tests may also be done. TREATMENT  Treatment usually involves taking an antibiotic medicine. HOME CARE INSTRUCTIONS   Take your antibiotic medicine as directed by your health care provider. Finish the antibiotic even if you start to feel better.  Keep the infected arm or leg elevated to reduce swelling.  Apply a warm cloth to the affected area up to 4 times per day to relieve pain.  Take medicines only as directed by your health care provider.  Keep all follow-up visits as directed by your health care provider. SEEK MEDICAL CARE IF:   You notice red streaks coming from the infected area.  Your red area gets larger or turns dark in color.  Your bone or joint underneath the infected area becomes painful after the skin has healed.  Your infection returns in the same  area or another area.  You notice a swollen bump in the infected area.  You develop new symptoms.  You have a fever. SEEK IMMEDIATE MEDICAL CARE IF:   You feel very sleepy.  You develop vomiting or diarrhea.  You have a general ill feeling (malaise) with muscle aches and pains.   This information is not intended to replace advice given to you by your health care provider. Make sure you discuss any questions you have with your health care provider.   Document Released: 07/02/2005 Document Revised: 06/13/2015 Document Reviewed: 12/08/2011 Elsevier Interactive Patient Education [employer]2016 Elsevier Inc.

## 2016-07-23 NOTE — Progress Notes (Signed)
Subjective:  Patient ID: Loretta [name], female    DOB: 12-27-1960  Age: 55 y.o. MRN: [id number]  CC: Ankle Pain   HPI Loretta [name] presents for    1. R ankle pain: first noted soreness two weeks ago during illness following cat scratch. She developed swelling and redness in medial and lateral ankle that started 5 days ago. No recent injury to ankle or foot. Has hx of spraining and rolling her R ankle. She reports fever and chills. She is elevating, compressing, icing R foot and ankle. She is taking ibuprofen 600 mg q AM and last took it this AM.    Social History  Substance Use Topics  . Smoking status: Former Smoker    Types: Cigarettes    Quit date: 02/05/2008  . Smokeless tobacco: Never Used  . Alcohol use Yes     Comment: Occassionally    Outpatient Medications Prior to Visit  Medication Sig Dispense Refill  . cetirizine (ZYRTEC) 10 MG tablet Take 1 tablet (10 mg total) by mouth daily. 30 tablet 11  . diazepam (VALIUM) 5 MG tablet Take 1 tablet (5 mg total) by mouth at bedtime. 30 tablet 1  . diclofenac sodium (VOLTAREN) 1 % GEL Apply 2 g topically 4 (four) times daily. 100 g 3  . hydrocortisone-pramoxine (ANALPRAM-HC) 2.5-1 % rectal cream Place 1 application rectally 3 (three) times daily. 30 g 5  . pantoprazole (PROTONIX) 40 MG tablet Twice daily before meals for next 2 weeks, then once nightly  before supper 60 tablet 3  . azithromycin (ZITHROMAX) 250 MG tablet Take 2 today then 1 daily (Patient not taking: Reported on 07/23/2016) 6 tablet 0  . ondansetron (ZOFRAN-ODT) 4 MG disintegrating tablet Take 1 tablet (4 mg total) by mouth every 8 (eight) hours as needed for nausea or vomiting. (Patient not taking: Reported on 07/23/2016) 20 tablet 0   No facility-administered medications prior to visit.     ROS Review of Systems  Constitutional: Positive for chills and fever.  Eyes: Negative for visual disturbance.  Respiratory: Negative for shortness of breath.     Cardiovascular: Negative for chest pain.  Gastrointestinal: Positive for rectal pain. Negative for abdominal pain and blood in stool.  Musculoskeletal: Positive for arthralgias and joint swelling. Negative for back pain.  Skin: Negative for rash.  Allergic/Immunologic: Negative for immunocompromised state.  Hematological: Negative for adenopathy. Does not bruise/bleed easily.  Psychiatric/Behavioral: Positive for sleep disturbance. Negative for dysphoric mood and suicidal ideas.    Objective:  BP 120/80 (BP Location: Right Arm, Patient Position: Sitting, Cuff Size: Small)   Pulse 77   Temp 98.1 F (36.7 C) (Oral)   Ht 5\' 4"  (1.626 m)   Wt 141 lb (64 kg)   SpO2 99%   BMI 24.20 kg/m   BP/Weight 07/23/2016 07/10/2016 06/20/2016  Systolic BP 120 132 126  Diastolic BP 80 85 81  Wt. (Lbs) 141 140.4 139  BMI 24.2 24.1 23.86   Physical Exam  Constitutional: She is oriented to person, place, and time. She appears well-developed and well-nourished. No distress.  HENT:  Head: Normocephalic and atraumatic.  Cardiovascular: Normal rate, regular rhythm, normal heart sounds and intact distal pulses.   Pulses:      Dorsalis pedis pulses are 2+ on the right side.       Posterior tibial pulses are 2+ on the right side.  Pulmonary/Chest: Effort normal and breath sounds normal.  Musculoskeletal: She exhibits no edema.  Right ankle: She exhibits swelling. Tenderness.       Legs:      Right foot: There is tenderness and swelling.  Lymphadenopathy:       Right: No inguinal adenopathy present.       Left: No inguinal adenopathy present.  Neurological: She is alert and oriented to person, place, and time.  Skin: Skin is warm and dry. No rash noted.  Psychiatric: She has a normal mood and affect.    Assessment & Plan:  Nena Jordanerrie was seen today for ankle pain.  Diagnoses and all orders for this visit:  Cellulitis of right ankle -     CBC -     COMPLETE METABOLIC PANEL WITH GFR -      Culture, blood (single) w Reflex to ID Panel -     sulfamethoxazole-trimethoprim (BACTRIM DS,SEPTRA DS) 800-160 MG tablet; Take 1 tablet by mouth 2 (two) times daily. -     Uric acid   There are no diagnoses linked to this encounter.  No orders of the defined types were placed in this encounter.   Follow-up: Return in about 2 days (around 07/25/2016) for R ankle cellulitis .   Dessa PhiJosalyn Amilio Zehnder MD

## 2016-07-23 NOTE — Progress Notes (Signed)
Pt right ankle is swollen and red.  Pt declined flu shot.

## 2016-07-24 ENCOUNTER — Telehealth: Payer: Self-pay

## 2016-07-24 NOTE — Telephone Encounter (Signed)
Pt was called and informed of lab results. Pt is aware to continue taking bactrim for foot pain.

## 2016-07-25 ENCOUNTER — Encounter: Payer: Self-pay | Admitting: Family Medicine

## 2016-07-25 ENCOUNTER — Ambulatory Visit: Payer: Self-pay | Attending: Family Medicine | Admitting: Family Medicine

## 2016-07-25 VITALS — BP 113/64 | HR 76 | Temp 98.4°F | Ht 64.0 in | Wt 140.0 lb

## 2016-07-25 DIAGNOSIS — L03115 Cellulitis of right lower limb: Secondary | ICD-10-CM | POA: Insufficient documentation

## 2016-07-25 DIAGNOSIS — K5900 Constipation, unspecified: Secondary | ICD-10-CM | POA: Insufficient documentation

## 2016-07-25 DIAGNOSIS — R1013 Epigastric pain: Secondary | ICD-10-CM | POA: Insufficient documentation

## 2016-07-25 DIAGNOSIS — Z79899 Other long term (current) drug therapy: Secondary | ICD-10-CM | POA: Insufficient documentation

## 2016-07-25 DIAGNOSIS — F3176 Bipolar disorder, in full remission, most recent episode depressed: Secondary | ICD-10-CM | POA: Insufficient documentation

## 2016-07-25 DIAGNOSIS — Z87891 Personal history of nicotine dependence: Secondary | ICD-10-CM | POA: Insufficient documentation

## 2016-07-25 DIAGNOSIS — G8929 Other chronic pain: Secondary | ICD-10-CM | POA: Insufficient documentation

## 2016-07-25 DIAGNOSIS — K649 Unspecified hemorrhoids: Secondary | ICD-10-CM | POA: Insufficient documentation

## 2016-07-25 DIAGNOSIS — M7989 Other specified soft tissue disorders: Secondary | ICD-10-CM | POA: Insufficient documentation

## 2016-07-25 MED ORDER — PANTOPRAZOLE SODIUM 40 MG PO TBEC: 40.0000 mg | DELAYED_RELEASE_TABLET | Freq: Two times a day (BID) | ORAL | 3 refills | Status: DC

## 2016-07-25 MED ORDER — DIAZEPAM 10 MG PO TABS: 10.0000 mg | ORAL_TABLET | Freq: Every day | ORAL | 1 refills | Status: DC

## 2016-07-25 MED FILL — HYDROCORT-PRAMOXINE 2.5%-1%: 2.5-1 | 10 days supply | Qty: 30 | Fill #3

## 2016-07-25 NOTE — Progress Notes (Signed)
Subjective:  Patient ID: Loretta [name], female    DOB: 19-Mar-1961  Age: 55 y.o. MRN: [id number]  CC: Cellulitis (recheack ankle(right))   HPI Loretta [name] presents for    1. R leg cellulitis: first noted soreness two weeks ago during illness following cat scratch. She developed swelling and redness in medial and lateral ankle that started 7 days ago. No recent injury to ankle or foot. Has hx of spraining and rolling her R ankle. She reports fever and chills. She is elevating, compressing, icing R foot and ankle. She is taking ibuprofen 600 mg q AM and last took it this AM.   I saw her 2 days ago. She started bactrim. Had slightly elevated WBC on CBC.   Swelling and redness is improving. She has 1 red papule on both anterior lower legs. She is walking with crutches. Taking ibuprofen prn pain.  She is having trouble sleeping request valium dose increase She is having more bleeding from hemorrhoids and mild constipation. Used analpram HC.    Social History  Substance Use Topics  . Smoking status: Former Smoker    Types: Cigarettes    Quit date: 02/05/2008  . Smokeless tobacco: Never Used  . Alcohol use Yes     Comment: Occassionally    Outpatient Medications Prior to Visit  Medication Sig Dispense Refill  . cetirizine (ZYRTEC) 10 MG tablet Take 1 tablet (10 mg total) by mouth daily. 30 tablet 11  . diazepam (VALIUM) 5 MG tablet Take 1 tablet (5 mg total) by mouth at bedtime. 30 tablet 1  . diclofenac sodium (VOLTAREN) 1 % GEL Apply 2 g topically 4 (four) times daily. 100 g 3  . hydrocortisone-pramoxine (ANALPRAM-HC) 2.5-1 % rectal cream Place 1 application rectally 3 (three) times daily. 30 g 5  . pantoprazole (PROTONIX) 40 MG tablet Twice daily before meals for next 2 weeks, then once nightly  before supper 60 tablet 3  . sulfamethoxazole-trimethoprim (BACTRIM DS,SEPTRA DS) 800-160 MG tablet Take 1 tablet by mouth 2 (two) times daily. 20 tablet 0   No facility-administered  medications prior to visit.     ROS Review of Systems  Constitutional: Positive for chills and fever.  Eyes: Negative for visual disturbance.  Respiratory: Negative for shortness of breath.   Cardiovascular: Negative for chest pain.  Gastrointestinal: Positive for anal bleeding and rectal pain. Negative for abdominal pain and blood in stool.  Musculoskeletal: Positive for arthralgias and joint swelling. Negative for back pain.  Skin: Negative for rash.  Allergic/Immunologic: Negative for immunocompromised state.  Hematological: Negative for adenopathy. Does not bruise/bleed easily.  Psychiatric/Behavioral: Positive for sleep disturbance. Negative for dysphoric mood and suicidal ideas.    Objective:  BP 113/64 (BP Location: Left Arm, Patient Position: Sitting, Cuff Size: Small)   Pulse 76   Temp 98.4 F (36.9 C) (Oral)   Ht 5\' 4"  (1.626 m)   Wt 140 lb (63.5 kg)   SpO2 99%   BMI 24.03 kg/m   BP/Weight 07/25/2016 07/23/2016 07/10/2016  Systolic BP 113 120 132  Diastolic BP 64 80 85  Wt. (Lbs) 140 141 140.4  BMI 24.03 24.2 24.1   Physical Exam  Constitutional: She is oriented to person, place, and time. She appears well-developed and well-nourished. No distress.  HENT:  Head: Normocephalic and atraumatic.  Cardiovascular: Normal rate, regular rhythm, normal heart sounds and intact distal pulses.   Pulses:      Dorsalis pedis pulses are 2+ on the right side.  Posterior tibial pulses are 2+ on the right side.  Pulmonary/Chest: Effort normal and breath sounds normal.  Musculoskeletal: She exhibits no edema.       Right ankle: She exhibits swelling. Tenderness.       Legs:      Right foot: There is tenderness and swelling.  Lymphadenopathy:       Right: No inguinal adenopathy present.       Left: No inguinal adenopathy present.  Neurological: She is alert and oriented to person, place, and time.  Skin: Skin is warm and dry. No rash noted.  Psychiatric: She has a normal  mood and affect.    Assessment & Plan:  [name] was seen today for follow-up.  Diagnoses and all orders for this visit:  Cellulitis of right ankle  Abdominal pain, chronic, epigastric -     pantoprazole (PROTONIX) 40 MG tablet; Take 1 tablet (40 mg total) by mouth 2 (two) times daily before a meal.  Bipolar disorder, in full remission, most recent episode depressed (HCC) -     diazepam (VALIUM) 10 MG tablet; Take 1 tablet (10 mg total) by mouth at bedtime.   There are no diagnoses linked to this encounter.  No orders of the defined types were placed in this encounter.   Follow-up: Return in about 10 days (around 08/04/2016) for R lower leg cellulitis .   Dessa Phi MD

## 2016-07-25 NOTE — Patient Instructions (Addendum)
Loretta [name] was seen today for follow-up.  Diagnoses and all orders for this visit:  Cellulitis of right ankle  Abdominal pain, chronic, epigastric -     pantoprazole (PROTONIX) 40 MG tablet; Take 1 tablet (40 mg total) by mouth 2 (two) times daily before a meal.  Bipolar disorder, in full remission, most recent episode depressed (HCC) -     diazepam (VALIUM) 10 MG tablet; Take 1 tablet (10 mg total) by mouth at bedtime.   Cellulitis is improving  Finish bactrim We will keep on exam red papules on legs, I made a note of them   F/u in 10-14 days for R lower leg and foot cellulitis   Dr. Armen PickupFunches

## 2016-07-25 NOTE — Assessment & Plan Note (Signed)
Improved with bactrim Noted small erythematous papules on legs  No fever Finish bactrim  F.u in 10-14 days for recheck

## 2016-07-28 ENCOUNTER — Telehealth: Payer: Self-pay | Admitting: Family Medicine

## 2016-07-28 NOTE — Telephone Encounter (Signed)
Reviewed  Patient called back verified name and DOB  She is getting raised bumps on arms and legs. pinkish red. Itch occasionally but not that often. No ulcers or sore.  No oral lesions or swelling. No cough.  She is on day 6 of bactrim for R lower leg cellulitis. Overall her cellulitis is improving.  I suspect this is a drug rash.  Advised continue daily zyrtec, add benadryl at night.  Stop bactrim if rash spreads, she develops sore on skin or mouth or cough or tongue swelling.  She is to call if symptoms worsen if no same day appt open she is to go to urgent care or ED.  She agrees with plan and voices understanding.

## 2016-07-28 NOTE — Telephone Encounter (Signed)
Patient said that she's having red spots on legs and backs of arms. She said there's blisters Patient is needing advice on what to do.

## 2016-07-29 ENCOUNTER — Telehealth: Payer: Self-pay | Admitting: Family Medicine

## 2016-07-29 DIAGNOSIS — R1013 Epigastric pain: Principal | ICD-10-CM

## 2016-07-29 DIAGNOSIS — G8929 Other chronic pain: Secondary | ICD-10-CM

## 2016-07-29 MED ORDER — PANTOPRAZOLE SODIUM 40 MG PO TBEC: 40.0000 mg | DELAYED_RELEASE_TABLET | Freq: Two times a day (BID) | ORAL | 3 refills | Status: DC

## 2016-07-29 MED FILL — PANTOPRAZOLE SOD DR 40 MG T: 40 | 30 days supply | Qty: 60 | Fill #0

## 2016-07-29 NOTE — Telephone Encounter (Signed)
Patient called the office to request refill for pantoprazole (PROTONIX) 40 MG tablet. Please send it to our pharmacy.  Thank you.

## 2016-07-29 NOTE — Telephone Encounter (Signed)
Pantoprazole refilled. 

## 2016-07-30 LAB — CULTURE, BLOOD (SINGLE): Organism ID, Bacteria: NO GROWTH

## 2016-08-01 ENCOUNTER — Telehealth: Payer: Self-pay

## 2016-08-01 ENCOUNTER — Telehealth: Payer: Self-pay | Admitting: Family Medicine

## 2016-08-01 DIAGNOSIS — L03115 Cellulitis of right lower limb: Secondary | ICD-10-CM

## 2016-08-01 DIAGNOSIS — M25472 Effusion, left ankle: Secondary | ICD-10-CM

## 2016-08-01 NOTE — Telephone Encounter (Signed)
Pt was called on 10/27 and informed of lab results. 

## 2016-08-01 NOTE — Telephone Encounter (Signed)
Patient called the office to speak with nurse regarding her footand ankle. Pt has seen an improvement on her foot but her ankles are still swollen. Ankles are also black and blue. Pt wants to know if she needs to continue with antibiotic and if so, to please call in a refill. Please follow up.  Thank you.

## 2016-08-04 NOTE — Telephone Encounter (Signed)
I do not believes she needs another course of antibiotics at this time.  Since she is swelling in both ankles x-ray ordered, she is to go to [facility] to complete.   I will re-evaluate at her f/u appt, but she is advised to seek sooner care if she develops worsening discoloration, pain, swelling  or fever

## 2016-08-05 MED FILL — VOLTAREN 1% GEL: 1 | 30 days supply | Qty: 100 | Fill #1

## 2016-08-05 MED FILL — HYDROCORT-PRAMOXINE 2.5%-1%: 2.5-1 | 10 days supply | Qty: 30 | Fill #4

## 2016-08-05 NOTE — Telephone Encounter (Signed)
Patient hipaa verified per guidelines. Rn advised patient per Dr. Armen PickupFunches: I do not believes she needs another course of antibiotics at this time.  Since she is swelling in both ankles x-ray ordered, she is to go to [facility] to complete.   I will re-evaluate at her f/u appt, but she is advised to seek sooner care if she develops worsening discoloration, pain, swelling  or fever   Patient verbalized understanding. States she will attempt to x-ray today.

## 2016-08-06 ENCOUNTER — Ambulatory Visit (HOSPITAL_COMMUNITY)
Admission: RE | Admit: 2016-08-06 | Discharge: 2016-08-06 | Disposition: A | Discharge status: Home / Self Care | Payer: Self-pay | Source: Ambulatory Visit | Attending: Family Medicine | Admitting: Family Medicine

## 2016-08-06 DIAGNOSIS — L03115 Cellulitis of right lower limb: Secondary | ICD-10-CM | POA: Insufficient documentation

## 2016-08-06 DIAGNOSIS — M25472 Effusion, left ankle: Secondary | ICD-10-CM

## 2016-08-06 DIAGNOSIS — M7989 Other specified soft tissue disorders: Secondary | ICD-10-CM | POA: Insufficient documentation

## 2016-08-07 ENCOUNTER — Encounter: Payer: Self-pay | Admitting: Family Medicine

## 2016-08-07 ENCOUNTER — Ambulatory Visit: Payer: Self-pay | Attending: Family Medicine | Admitting: Family Medicine

## 2016-08-07 DIAGNOSIS — L03115 Cellulitis of right lower limb: Secondary | ICD-10-CM | POA: Insufficient documentation

## 2016-08-07 DIAGNOSIS — Z87891 Personal history of nicotine dependence: Secondary | ICD-10-CM | POA: Insufficient documentation

## 2016-08-07 DIAGNOSIS — K644 Residual hemorrhoidal skin tags: Secondary | ICD-10-CM | POA: Insufficient documentation

## 2016-08-07 MED ORDER — HYDROCORTISONE ACE-PRAMOXINE 2.5-1 % RE CREA: TOPICAL_CREAM | RECTAL | 11 refills | Status: DC

## 2016-08-07 MED FILL — HYDROCORT-PRAMOXINE 2.5-1%: 2.5-1 | 30 days supply | Qty: 90 | Fill #0

## 2016-08-07 NOTE — Assessment & Plan Note (Addendum)
Painful bleeding hemorrhoids Refilled analpram-HC Due to cost limitations patient will need to limit use to 1 gram 3 times a day instead for 4.

## 2016-08-07 NOTE — Assessment & Plan Note (Signed)
Resolved

## 2016-08-07 NOTE — Progress Notes (Signed)
Subjective:  Patient ID: Loretta [name], female    DOB: 11/25/1960  Age: 55 y.o. MRN: [id number]  CC: Cellulitis   HPI Loretta Blenderrie D Bullard has chronic hemorrhoids presents for    1. R leg cellulitis follow up: started with pain in R ankle on 07/18/2016. She presented to care on 07/23/16 and was started on bactrim for cellulitis. Her WBC was slightly elevated at  10.9. Her blood cultures were negative She has f/u two days later with improvement in redness but persistent swelling and a few red spots on her legs. She has since completed the bactrim course and had x-rays of both of her ankles which were negative for fracture.   Today, she reports great improvement. She still has some swelling. No pain or redness. No rash.   2. Chronic hemorrhoids: still bleeding. analpram- HC. She is running out of analpram-HC quickly. She is apply 4 grams 3 times a day.   Social History  Substance Use Topics  . Smoking status: Former Smoker    Types: Cigarettes    Quit date: 02/05/2008  . Smokeless tobacco: Never Used  . Alcohol use Yes     Comment: Occassionally    Outpatient Medications Prior to Visit  Medication Sig Dispense Refill  . cetirizine (ZYRTEC) 10 MG tablet Take 1 tablet (10 mg total) by mouth daily. 30 tablet 11  . diazepam (VALIUM) 10 MG tablet Take 1 tablet (10 mg total) by mouth at bedtime. 30 tablet 1  . diclofenac sodium (VOLTAREN) 1 % GEL Apply 2 g topically 4 (four) times daily. 100 g 3  . hydrocortisone-pramoxine (ANALPRAM-HC) 2.5-1 % rectal cream Place 1 application rectally 3 (three) times daily. 30 g 5  . pantoprazole (PROTONIX) 40 MG tablet Take 1 tablet (40 mg total) by mouth 2 (two) times daily before a meal. 60 tablet 3  . sulfamethoxazole-trimethoprim (BACTRIM DS,SEPTRA DS) 800-160 MG tablet Take 1 tablet by mouth 2 (two) times daily. 20 tablet 0   No facility-administered medications prior to visit.     ROS Review of Systems  Constitutional: Negative for chills and  fever.  Eyes: Negative for visual disturbance.  Respiratory: Negative for shortness of breath.   Cardiovascular: Negative for chest pain.  Gastrointestinal: Positive for anal bleeding and rectal pain. Negative for abdominal pain and blood in stool.  Musculoskeletal: Positive for arthralgias and joint swelling. Negative for back pain.  Skin: Negative for rash.  Allergic/Immunologic: Negative for immunocompromised state.  Hematological: Negative for adenopathy. Does not bruise/bleed easily.  Psychiatric/Behavioral: Positive for sleep disturbance. Negative for dysphoric mood and suicidal ideas.    Objective:  BP 122/77 (BP Location: Right Arm, Patient Position: Sitting, Cuff Size: Small)   Pulse 65   Temp 98.6 F (37 C) (Oral)   Ht 5\' 4"  (1.626 m)   Wt 142 lb 9.6 oz (64.7 kg)   SpO2 99%   BMI 24.48 kg/m   BP/Weight 08/07/2016 07/25/2016 07/23/2016  Systolic BP 122 113 120  Diastolic BP 77 64 80  Wt. (Lbs) 142.6 140 141  BMI 24.48 24.03 24.2   Physical Exam  Constitutional: She is oriented to person, place, and time. She appears well-developed and well-nourished. No distress.  HENT:  Head: Normocephalic and atraumatic.  Cardiovascular: Normal rate, regular rhythm, normal heart sounds and intact distal pulses.   Pulses:      Dorsalis pedis pulses are 2+ on the right side.       Posterior tibial pulses are 2+ on the right  side.  Pulmonary/Chest: Effort normal and breath sounds normal.  Musculoskeletal: She exhibits no edema.       Right ankle: She exhibits swelling (anterior and lateral ). No tenderness.       Legs:      Right foot: There is no tenderness and no swelling.  Neurological: She is alert and oriented to person, place, and time.  Skin: Skin is warm and dry. No rash noted.  Psychiatric: She has a normal mood and affect.    Assessment & Plan:  Nena Jordanerrie was seen today for cellulitis.  Diagnoses and all orders for this visit:  External hemorrhoids -      hydrocortisone-pramoxine (ANALPRAM-HC) 2.5-1 % rectal cream; Apply 4 grams 3 times daily to per rectum   There are no diagnoses linked to this encounter.  No orders of the defined types were placed in this encounter.   Follow-up: No Follow-up on file.   Dessa PhiJosalyn Jonahtan Manseau MD

## 2016-08-07 NOTE — Progress Notes (Signed)
Pt is here today to follow up on foot cellulitis.  Pt declined flu shot.

## 2016-08-07 NOTE — Addendum Note (Signed)
Addended by: Dessa PhiFUNCHES, Tylique Aull on: 08/07/2016 11:32 AM   Modules accepted: Orders

## 2016-08-07 NOTE — Patient Instructions (Addendum)
Malanie was seen today for cellulitis.  Diagnoses and all orders for this visit:  External hemorrhoids -     hydrocortisone-pramoxine (ANALPRAM-HC) 2.5-1 % rectal cream; Apply 4 grams 3 times daily to per rectum  Cellulitis of right ankle  cellulitis has resolved  Be sure to apply for [place] discount and orange card if you have not already done so.   F/u in 2 months hemorrhoids   Dr. Armen PickupFunches

## 2016-08-20 NOTE — Telephone Encounter (Signed)
Please call patient to address hydrocortisone. Says shes supposed to take 4 grams but medication instructions say take 1 gram 3 times daily Please follow up.

## 2016-08-20 NOTE — Telephone Encounter (Signed)
Pt called today to inform you that she is running out of anal pram cream. The instructions on the bottle that she received says take 1 gram 3 times a day, but when she came to last OV she stated that you instructed her to use 4 grams 3 times a day. Please follow up.

## 2016-08-21 MED FILL — HYDROCORT-PRAMOXINE 2.5-1%: 2.5-1 | 30 days supply | Qty: 360 | Fill #0

## 2016-08-21 NOTE — Telephone Encounter (Signed)
It turns out this med is very expensive She is asked to use 1 gram for now on  She can come pick up the refill from the pharmacy

## 2016-08-21 NOTE — Telephone Encounter (Signed)
Pt was called and informed of cream being changed to 2 grams instead of 4 grams.pt phone disconnected while I was talking to her.

## 2016-08-26 MED FILL — ?PANTOPRAZOLE SOD DR 40MG: 40 MG | 30 days supply | Qty: 60 | Fill #0

## 2016-09-01 MED FILL — HYDROCORT-PRAMOXINE 2.5-1%: 2.5-1 | 30 days supply | Qty: 90 | Fill #1

## 2016-09-02 ENCOUNTER — Other Ambulatory Visit: Payer: Self-pay

## 2016-09-02 DIAGNOSIS — K644 Residual hemorrhoidal skin tags: Secondary | ICD-10-CM

## 2016-09-02 MED ORDER — HYDROCORTISONE ACE-PRAMOXINE 2.5-1 % RE CREA: TOPICAL_CREAM | RECTAL | 11 refills | Status: DC

## 2016-09-02 MED FILL — HYDROCORT-PRAMOXINE 2.5-1%: 2.5-1 | 90 days supply | Qty: 90 | Fill #0

## 2016-09-05 ENCOUNTER — Other Ambulatory Visit: Payer: Self-pay

## 2016-09-05 DIAGNOSIS — K644 Residual hemorrhoidal skin tags: Secondary | ICD-10-CM

## 2016-09-05 MED ORDER — HYDROCORTISONE ACE-PRAMOXINE 2.5-1 % RE CREA: TOPICAL_CREAM | RECTAL | 3 refills | Status: DC

## 2016-09-08 ENCOUNTER — Other Ambulatory Visit: Payer: Self-pay

## 2016-09-08 DIAGNOSIS — K644 Residual hemorrhoidal skin tags: Secondary | ICD-10-CM

## 2016-09-08 MED ORDER — HYDROCORTISONE ACE-PRAMOXINE 2.5-1 % RE CREA: TOPICAL_CREAM | RECTAL | 6 refills | Status: DC

## 2016-09-08 NOTE — Telephone Encounter (Signed)
Printed script for pass. 

## 2016-09-25 ENCOUNTER — Telehealth: Payer: Self-pay | Admitting: Family Medicine

## 2016-09-25 DIAGNOSIS — F3176 Bipolar disorder, in full remission, most recent episode depressed: Secondary | ICD-10-CM

## 2016-09-25 MED ORDER — DIAZEPAM 10 MG PO TABS: 10.0000 mg | ORAL_TABLET | Freq: Every day | ORAL | 2 refills | Status: DC

## 2016-09-25 MED FILL — ?PANTOPRAZOLE SOD DR 40MG: 40 MG | 30 days supply | Qty: 60 | Fill #1

## 2016-09-25 NOTE — Telephone Encounter (Signed)
Pt was called and informed of script being ready for pick up.

## 2016-09-25 NOTE — Telephone Encounter (Signed)
Pt. Called requesting a refill on diazepam (VALIUM) 10 MG tablet  °Please f/u °

## 2016-09-25 NOTE — Telephone Encounter (Signed)
Please inform patient Valium ready for pick up

## 2016-10-07 ENCOUNTER — Ambulatory Visit: Payer: Self-pay | Attending: Family Medicine | Admitting: Family Medicine

## 2016-10-07 ENCOUNTER — Encounter: Payer: Self-pay | Admitting: Licensed Clinical Social Worker

## 2016-10-07 ENCOUNTER — Encounter: Payer: Self-pay | Admitting: Family Medicine

## 2016-10-07 VITALS — BP 126/76 | HR 73 | Temp 98.3°F | Ht 64.0 in | Wt 143.8 lb

## 2016-10-07 DIAGNOSIS — K644 Residual hemorrhoidal skin tags: Secondary | ICD-10-CM | POA: Insufficient documentation

## 2016-10-07 DIAGNOSIS — F32A Depression, unspecified: Secondary | ICD-10-CM | POA: Insufficient documentation

## 2016-10-07 DIAGNOSIS — F3176 Bipolar disorder, in full remission, most recent episode depressed: Secondary | ICD-10-CM | POA: Insufficient documentation

## 2016-10-07 DIAGNOSIS — Z87891 Personal history of nicotine dependence: Secondary | ICD-10-CM | POA: Insufficient documentation

## 2016-10-07 DIAGNOSIS — R45851 Suicidal ideations: Secondary | ICD-10-CM | POA: Insufficient documentation

## 2016-10-07 DIAGNOSIS — Z1159 Encounter for screening for other viral diseases: Secondary | ICD-10-CM

## 2016-10-07 DIAGNOSIS — R109 Unspecified abdominal pain: Secondary | ICD-10-CM

## 2016-10-07 DIAGNOSIS — Z13818 Encounter for screening for other digestive system disorders: Secondary | ICD-10-CM | POA: Insufficient documentation

## 2016-10-07 DIAGNOSIS — F329 Major depressive disorder, single episode, unspecified: Secondary | ICD-10-CM

## 2016-10-07 DIAGNOSIS — R5383 Other fatigue: Secondary | ICD-10-CM | POA: Insufficient documentation

## 2016-10-07 DIAGNOSIS — J019 Acute sinusitis, unspecified: Secondary | ICD-10-CM

## 2016-10-07 DIAGNOSIS — G8929 Other chronic pain: Secondary | ICD-10-CM | POA: Insufficient documentation

## 2016-10-07 DIAGNOSIS — Z114 Encounter for screening for human immunodeficiency virus [HIV]: Secondary | ICD-10-CM | POA: Insufficient documentation

## 2016-10-07 LAB — CBC
HCT: 41.8 % (ref 35.0–45.0)
Hemoglobin: 13.7 g/dL (ref 11.7–15.5)
MCH: 28.1 pg (ref 27.0–33.0)
MCHC: 32.8 g/dL (ref 32.0–36.0)
MCV: 85.8 fL (ref 80.0–100.0)
MPV: 10.7 fL (ref 7.5–12.5)
PLATELETS: 348 10*3/uL (ref 140–400)
RBC: 4.87 MIL/uL (ref 3.80–5.10)
RDW: 14.8 % (ref 11.0–15.0)
WBC: 7 10*3/uL (ref 3.8–10.8)

## 2016-10-07 LAB — HEPATITIS C ANTIBODY: HCV AB: NEGATIVE

## 2016-10-07 MED ORDER — HYDROCORTISONE ACE-PRAMOXINE 2.5-1 % RE CREA: TOPICAL_CREAM | RECTAL | 11 refills | Status: DC

## 2016-10-07 MED ORDER — DOXYCYCLINE HYCLATE 100 MG PO TABS: 100.0000 mg | ORAL_TABLET | Freq: Two times a day (BID) | ORAL | 0 refills | Status: DC

## 2016-10-07 MED ORDER — HYDROCORTISONE ACE-PRAMOXINE 2.5-1 % RE CREA: TOPICAL_CREAM | RECTAL | 3 refills | Status: DC

## 2016-10-07 MED FILL — ?DOXYCYCLINE MONO 100 MG TA: 100 | 10 days supply | Qty: 20 | Fill #0

## 2016-10-07 NOTE — BH Specialist Note (Signed)
Session Start time: 9:50 am   End Time: 10:25 pm Total Time:  35 minutes Type of Service: Behavioral Health - Individual/Family Interpreter: No.   Interpreter Name & Language: N/A # [facility]   SUBJECTIVE: Loretta [name] is a 56 y.o. female  Pt. was referred by Dr. Armen PickupFunches for:  anxiety and depression. Pt. reports the following symptoms/concerns: depressed mood, low energy, decreased appetite, and suicidal ideations Duration of problem:  Pt was diagnosed with Bi-Polar Disorder. She has experienced Depressed Mood due to physical ailments Severity: severe Previous treatment: Pt participated in therapy over eighteen years ago. She is currently not receiving behavioral health services   OBJECTIVE: Mood: Anxious and Depressed & Affect: Tearful Risk of harm to self or others: Pt has hx of suicidal ideations. Pt denies plan or intent to self-harm and/or harm others Assessments administered: PHQ-9; GAD-7  LIFE CONTEXT:  Family & Social: Pt resides with boyfriend. She has an adult daughter and granddaughter who [place] in [place]. Pt is close with boyfriend's adult children and has a best friend who resides nearby School/ Work: Pt has been unemployed for approximately two years. She receives financial support from boyfriend Self-Care: Pt reports inability to exercise due to external hemorrhoids. She has been sleeping excessively and has a decreased appetite. Pt denied substance use Life changes: Pt's adult daughter and grand[place], Pt is grieving the loss of two pets (march 2017), and best friend is currently battling breast cancer. What is important to pt/family (values): Family and Good Health   GOALS ADDRESSED:  Decrease symptoms of depression Decrease symptoms of anxiety  INTERVENTIONS: Solution Focused, Strength-based and Supportive   ASSESSMENT:  Pt currently experiencing depression and anxiety triggered by pain caused by external  hemorrhoids. Pt reports depressed mood, low energy, decreased appetite, and suicidal ideations. Pt may benefit from psychoeducation, psychotherapy, and medication management. Pt disclosed that quality of life has decreased due to chronic pain. She receives emotional and financial support from boyfriend and his adult daughter. LCSWA educated pt on how unmanaged medical conditions can negatively impact mental health.  LCSWA educated pt on the cycle of depression and the importance of implementing healthy coping strategies to decrease symptoms. Pt identified healthy strategies to utilize daily to cope with chronic pain. Pt was encouraged to re-initiate therapy and medication management with a community agency ([facility] or [facility]), in addition, to Financial Counseling to inquire about approval for surgery. LCSWA discussed a safety plan with pt and provided resources for crisis interventions.     PLAN: 1. F/U with behavioral health clinician: Pt was encouraged to contact LCSWA if symptoms worsen or fail to improve to [facility]. 2. Behavioral Health meds: Valium 3. Behavioral recommendations: LCSWA recommends that pt apply healthy coping skills discussed, follow up with Financial Counseling, and initiate therapy/medication management with community agency. Pt is encouraged to schedule follow up appointment with LCSWA 4. Referral: [facility] Counseling/Psychotherapy, [facility], Problem-solving teaching/coping strategies, Psychoeducation and Supportive Counseling 5. From scale of 1-10, how likely are you to follow plan: 9/10   Loretta [name], MSW, LCSWA  Clinical Social Worker 10/07/2016  Warmhandoff:   Warm Hand Off Completed.

## 2016-10-07 NOTE — Patient Instructions (Addendum)
Elliott was seen today for hemorrhoids.  Diagnoses and all orders for this visit:  External hemorrhoids -     hydrocortisone-pramoxine (ANALPRAM-HC) 2.5-1 % rectal cream; Apply 4 grams 3 times daily to per rectum  Bipolar disorder, in full remission, most recent episode depressed (HCC)  Fatigue due to depression -     CBC -     Vitamin D, 25-hydroxy -     Iron and TIBC -     Ferritin  Need for hepatitis C screening test -     Hepatitis C antibody, reflex  Encounter for screening for HIV -     HIV antibody (with reflex)  Acute non-recurrent sinusitis, unspecified location -     doxycycline (VIBRA-TABS) 100 MG tablet; Take 1 tablet (100 mg total) by mouth 2 (two) times daily.  You have an upper respiratory infection   1. Drink plenty of fluids. Hot tea, soup etc will help open your nasal passages. 2. Dextromethorphan 30 mg every 6-8 hrs (plain Robitussin) for cough suppression 3. Tylenol for pain up to 1000 mg three times daily.  4. Nasal saline-OTC nose spray for congestion.   F/u for chest pain, shortness of breath, persistent high fever.  [facility]  [place] [place]  Address: [address], [place], [place] [address]  Hours:  Open today  8:30AM-5PM  Phone: [phone]  [facility] as [facility] for evaluation of bipolar depression and to start treatment  F/u in 4 weeks for depression   Dr. Armen PickupFunches

## 2016-10-07 NOTE — Assessment & Plan Note (Signed)
Continue analpram HC

## 2016-10-07 NOTE — Assessment & Plan Note (Signed)
Depression worsening [facility]

## 2016-10-07 NOTE — Progress Notes (Signed)
Pt is here today to follow up on hemorrhoids.

## 2016-10-07 NOTE — Progress Notes (Signed)
Subjective:  Patient ID: Loretta [name], female    DOB: 1961-08-05  Age: 56 y.o. MRN: [id number]  CC: Hemorrhoids   HPI PERSAIS ETHRIDGE presents for    1. Hemorrhoids: chronic. Using anal pram HC which controls symptoms. Still with pain and some bleeding. Has a very limited diet due to abdominal pain. She has been approved for medication assistance for anal-pram HC   2. Bipolar depression: since age 45. Declined. Feeling fatigue and depressed. Tearful. Taking valium for anxiety symptoms. Has been on Remeron and zoloft in the past. Not currently under the care of a mental health provider. Has suicidal thoughts but no plan.   3. Upper respiratory infection: x 2 weeks. No improvement. Cough, congestion, headache, earache, mild sore throat. No fever. Boyfriend with similar symptoms but improved in 4 days.  . Social History  Substance Use Topics  . Smoking status: Former Smoker    Types: Cigarettes    Quit date: 02/05/2008  . Smokeless tobacco: Never Used  . Alcohol use Yes     Comment: Occassionally    Outpatient Medications Prior to Visit  Medication Sig Dispense Refill  . cetirizine (ZYRTEC) 10 MG tablet Take 1 tablet (10 mg total) by mouth daily. 30 tablet 11  . diazepam (VALIUM) 10 MG tablet Take 1 tablet (10 mg total) by mouth at bedtime. 30 tablet 2  . diclofenac sodium (VOLTAREN) 1 % GEL Apply 2 g topically 4 (four) times daily. 100 g 3  . hydrocortisone-pramoxine (ANALPRAM-HC) 2.5-1 % rectal cream Apply 2 grams 3 times daily to per rectum 30 g 6  . pantoprazole (PROTONIX) 40 MG tablet Take 1 tablet (40 mg total) by mouth 2 (two) times daily before a meal. 60 tablet 3   No facility-administered medications prior to visit.     ROS Review of Systems  Constitutional: Positive for fatigue. Negative for chills and fever.  HENT: Positive for congestion, ear pain, sinus pain, sinus pressure and sore throat.   Eyes: Negative for visual disturbance.  Respiratory: Positive for  cough. Negative for shortness of breath.   Cardiovascular: Negative for chest pain.  Gastrointestinal: Positive for rectal pain. Negative for abdominal pain and blood in stool.  Musculoskeletal: Negative for arthralgias, back pain and joint swelling.  Skin: Negative for rash.  Allergic/Immunologic: Negative for immunocompromised state.  Hematological: Negative for adenopathy. Does not bruise/bleed easily.  Psychiatric/Behavioral: Positive for sleep disturbance and suicidal ideas. Negative for dysphoric mood.    Objective:  BP 126/76 (BP Location: Left Arm, Patient Position: Sitting, Cuff Size: Small)   Pulse 73   Temp 98.3 F (36.8 C) (Oral)   Ht 5\' 4"  (1.626 m)   Wt 143 lb 12.8 oz (65.2 kg)   SpO2 99%   BMI 24.68 kg/m   BP/Weight 10/07/2016 08/07/2016 07/25/2016  Systolic BP - 122 113  Diastolic BP - 77 64  Wt. (Lbs) 143.8 142.6 140  BMI 24.68 24.48 24.03   Physical Exam  Constitutional: She is oriented to person, place, and time. She appears well-developed and well-nourished. No distress.  HENT:  Head: Normocephalic and atraumatic.  Ears:  Cardiovascular: Normal rate, regular rhythm, normal heart sounds and intact distal pulses.   Pulmonary/Chest: Effort normal and breath sounds normal.  Musculoskeletal: She exhibits no edema.  Neurological: She is alert and oriented to person, place, and time.  Skin: Skin is warm and dry. No rash noted.  Psychiatric: She has a normal mood and affect.   Depression screen [facility] 2/9  10/07/2016 08/07/2016 07/23/2016 07/10/2016 06/20/2016  Decreased Interest 3 3 3  0 3  Down, Depressed, Hopeless 3 3 3  0 3  PHQ - 2 Score 6 6 6  0 6  Altered sleeping 3 3 3  - 3  Tired, decreased energy 3 3 3  - 3  Change in appetite 3 3 3  - 3  Feeling bad or failure about yourself  3 3 3  - 3  Trouble concentrating 3 3 3  - 3  Moving slowly or fidgety/restless 3 3 3  - 3  Suicidal thoughts 3 3 3  - 3  PHQ-9 Score 27 27 27  - 27   GAD 7 : Generalized Anxiety Score  10/07/2016 08/07/2016 07/23/2016 07/10/2016  Nervous, Anxious, on Edge 3 3 3 3   Control/stop worrying 3 3 3 3   Worry too much - different things 3 3 3 3   Trouble relaxing 3 3 3 3   Restless 3 3 3 3   Easily annoyed or irritable 3 3 3 3   Afraid - awful might happen 3 3 3 2   Total GAD 7 Score 21 21 21 20       Assessment & Plan:  Nena Jordanerrie was seen today for hemorrhoids.  Diagnoses and all orders for this visit:  External hemorrhoids -     Discontinue: hydrocortisone-pramoxine (ANALPRAM-HC) 2.5-1 % rectal cream; Apply 4 grams 3 times daily to per rectum -     hydrocortisone-pramoxine (ANALPRAM-HC) 2.5-1 % rectal cream; Apply 4 grams 3 times daily to per rectum  Bipolar disorder, in full remission, most recent episode depressed (HCC)  Fatigue due to depression -     CBC -     Vitamin D, 25-hydroxy -     Iron and TIBC -     Ferritin  Need for hepatitis C screening test -     Hepatitis C antibody, reflex  Encounter for screening for HIV -     HIV antibody (with reflex)  Acute non-recurrent sinusitis, unspecified location -     doxycycline (VIBRA-TABS) 100 MG tablet; Take 1 tablet (100 mg total) by mouth 2 (two) times daily.   There are no diagnoses linked to this encounter.  No orders of the defined types were placed in this encounter.   Follow-up: Return in about 4 weeks (around 11/04/2016) for depression .   Dessa PhiJosalyn Bubba Vanbenschoten MD

## 2016-10-07 NOTE — Assessment & Plan Note (Signed)
Suspect fatigue due to depression Checking CBC Iron and vit D

## 2016-10-08 LAB — HIV ANTIBODY (ROUTINE TESTING W REFLEX): HIV: NONREACTIVE

## 2016-10-08 LAB — IRON AND TIBC
%SAT: 15 % (ref 11–50)
Iron: 58 ug/dL (ref 45–160)
TIBC: 379 ug/dL (ref 250–450)
UIBC: 321 ug/dL (ref 125–400)

## 2016-10-08 LAB — VITAMIN D 25 HYDROXY (VIT D DEFICIENCY, FRACTURES): VIT D 25 HYDROXY: 41 ng/mL (ref 30–100)

## 2016-10-08 LAB — FERRITIN: Ferritin: 37 ng/mL (ref 10–232)

## 2016-10-09 ENCOUNTER — Telehealth: Payer: Self-pay

## 2016-10-09 NOTE — Telephone Encounter (Signed)
Pt was called and informed of lab results. 

## 2016-10-17 ENCOUNTER — Telehealth: Payer: Self-pay | Admitting: Family Medicine

## 2016-10-17 DIAGNOSIS — K644 Residual hemorrhoidal skin tags: Secondary | ICD-10-CM

## 2016-10-17 MED ORDER — HYDROCORTISONE ACE-PRAMOXINE 2.5-1 % RE CREA: TOPICAL_CREAM | RECTAL | 3 refills | Status: DC

## 2016-10-17 NOTE — Telephone Encounter (Signed)
Sent in ordered for 360 doses 30 day supply to onsite pharmacy Patient to call pharmacy

## 2016-10-17 NOTE — Telephone Encounter (Signed)
Patient is calling regarding prescription of hydrocortisone-pramoxine [facility](ANALPRAM-HC) 2.5-1 % rectal cream  Says pharmacy here is not giving correct dosage of meds, they are giving her 90 "doses" and she is using 3 doses per day so she is supposed to get 90 days worth, not 90 doses.They are not giving her correct amount. Would like to have refill ASAP, as she is out of meds bc she was waiting on pharmaceutical company and needs her meds. Please f/u.

## 2016-10-24 MED FILL — ?PANTOPRAZOLE SOD DR 40MG: 40 MG | 30 days supply | Qty: 60 | Fill #2

## 2016-10-28 ENCOUNTER — Encounter: Payer: Self-pay | Admitting: Family Medicine

## 2016-10-28 ENCOUNTER — Encounter: Payer: Self-pay | Admitting: Licensed Clinical Social Worker

## 2016-10-28 ENCOUNTER — Ambulatory Visit: Payer: Self-pay | Attending: Family Medicine | Admitting: Family Medicine

## 2016-10-28 ENCOUNTER — Other Ambulatory Visit: Payer: Self-pay

## 2016-10-28 ENCOUNTER — Telehealth: Payer: Self-pay | Admitting: Family Medicine

## 2016-10-28 VITALS — BP 149/88 | HR 84 | Temp 98.4°F | Ht 64.0 in | Wt 147.4 lb

## 2016-10-28 DIAGNOSIS — K644 Residual hemorrhoidal skin tags: Secondary | ICD-10-CM | POA: Insufficient documentation

## 2016-10-28 DIAGNOSIS — G479 Sleep disorder, unspecified: Secondary | ICD-10-CM | POA: Insufficient documentation

## 2016-10-28 DIAGNOSIS — Z87891 Personal history of nicotine dependence: Secondary | ICD-10-CM | POA: Insufficient documentation

## 2016-10-28 DIAGNOSIS — R197 Diarrhea, unspecified: Secondary | ICD-10-CM | POA: Insufficient documentation

## 2016-10-28 DIAGNOSIS — F3176 Bipolar disorder, in full remission, most recent episode depressed: Secondary | ICD-10-CM | POA: Insufficient documentation

## 2016-10-28 DIAGNOSIS — Z79899 Other long term (current) drug therapy: Secondary | ICD-10-CM | POA: Insufficient documentation

## 2016-10-28 MED FILL — $ANALPRAM HC 2.5%-1% CREAM: 2.5-1 | 180 days supply | Qty: 210 | Fill #0

## 2016-10-28 NOTE — Telephone Encounter (Signed)
Patient came after her appointment today and wanted to let Dr Armen PickupFunches know that she has her surgery scheduled for Monday, 11/03/16, and she just wanted to update Dr Armen PickupFunches.

## 2016-10-28 NOTE — BH Specialist Note (Signed)
LCSWA spoke with pt during PCP visit. Pt reported that she has had difficulty obtaining prescriptions due to confusion regarding the correct dose of hemorrhoid medication. Pt disclosed that PCP was speaking to onsite pharmacy to assist in pt obtaining prescribed medication. LCSWA inquired about approval for surgery. Pt shared that she is awaiting to hear back about date for surgery noting that her financial assistance will expire in February 2018.    Pt reported that she has yet to initiate behavioral health services due to transportation barriers. LCSWA discussed benefits of psychotherapy and medication management. Pt plans to follow-up with services in the near future. No additional concerns reported.

## 2016-10-28 NOTE — Patient Instructions (Addendum)
Shalaine was seen today for depression.  Diagnoses and all orders for this visit:  External hemorrhoids  Bipolar disorder, in full remission, most recent episode depressed (HCC)   [facility]  Pick up 6 tubes of anal pram HC today, this is for 6 months from the company   F/u with me after GI visit    F/u in  6-8 weeks   Dr. Armen PickupFunches

## 2016-10-28 NOTE — Progress Notes (Signed)
Subjective:  Patient ID: Loretta [name], female    DOB: May 05, 1961  Age: 56 y.o. MRN: [id number]  CC: Depression   HPI Loretta [name] presents for   1. Bipolar depression: since age 56. Declined. Feeling fatigue and depressed. Tearful. Taking valium for anxiety symptoms. Has been on Remeron and zoloft in the past. Not currently under the care of a mental health provider. Has suicidal thoughts but no plan. She did not go to mental health. She states she did not go because she has been out of hemorrhoid cream and she has transportation problems. She states she will go [place] and she has open access to the car.     1. Hemorrhoids: chronic. [facility]. Having painful enlarged hemorrhoids. She also developed diarrhea after course of doxycyline last month for upper respiratory infection.  . Social History  Substance Use Topics  . Smoking status: Former Smoker    Types: Cigarettes    Quit date: 02/05/2008  . Smokeless tobacco: Never Used  . Alcohol use Yes     Comment: Occassionally    Outpatient Medications Prior to Visit  Medication Sig Dispense Refill  . cetirizine (ZYRTEC) 10 MG tablet Take 1 tablet (10 mg total) by mouth daily. 30 tablet 11  . diazepam (VALIUM) 10 MG tablet Take 1 tablet (10 mg total) by mouth at bedtime. 30 tablet 2  . diclofenac sodium (VOLTAREN) 1 % GEL Apply 2 g topically 4 (four) times daily. 100 g 3  . hydrocortisone-pramoxine (ANALPRAM-HC) 2.5-1 % rectal cream Apply 4 grams 3 times daily to per rectum 360 g 3  . pantoprazole (PROTONIX) 40 MG tablet Take 1 tablet (40 mg total) by mouth 2 (two) times daily before a meal. 60 tablet 3  . doxycycline (VIBRA-TABS) 100 MG tablet Take 1 tablet (100 mg total) by mouth 2 (two) times daily. (Patient not taking: Reported on 10/28/2016) 20 tablet 0   No facility-administered medications prior to visit.     ROS Review of Systems  Constitutional: Positive for  fatigue. Negative for chills and fever.  HENT: Positive for congestion and sinus pain. Negative for ear pain, sinus pressure and sore throat.   Eyes: Negative for visual disturbance.  Respiratory: Negative for cough and shortness of breath.   Cardiovascular: Negative for chest pain.  Gastrointestinal: Positive for diarrhea and rectal pain. Negative for abdominal pain and blood in stool.  Musculoskeletal: Negative for arthralgias, back pain and joint swelling.  Skin: Negative for rash.  Allergic/Immunologic: Negative for immunocompromised state.  Hematological: Negative for adenopathy. Does not bruise/bleed easily.  Psychiatric/Behavioral: Positive for sleep disturbance and suicidal ideas. Negative for dysphoric mood.    Objective:  BP (!) 149/88 (BP Location: Left Arm, Patient Position: Sitting, Cuff Size: Small)   Pulse 84   Temp 98.4 F (36.9 C) (Oral)   Ht 5\' 4"  (1.626 m)   Wt 147 lb 6.4 oz (66.9 kg)   SpO2 98%   BMI 25.30 kg/m   BP/Weight 10/28/2016 10/07/2016 08/07/2016  Systolic BP 149 126 122  Diastolic BP 88 76 77  Wt. (Lbs) 147.4 143.8 142.6  BMI 25.3 24.68 24.48   Physical Exam  Constitutional: She is oriented to person, place, and time. She appears well-developed and well-nourished. No distress.  HENT:  Head: Normocephalic and atraumatic.  Cardiovascular: Normal rate, regular rhythm, normal heart sounds and intact distal pulses.   Pulmonary/Chest: Effort normal and breath sounds  normal.  Musculoskeletal: She exhibits no edema.  Neurological: She is alert and oriented to person, place, and time.  Skin: Skin is warm and dry. No rash noted.  Psychiatric: She has a normal mood and affect.   Depression screen [facility] 2/9 10/28/2016 10/07/2016 08/07/2016 07/23/2016 07/10/2016  Decreased Interest 3 3 3 3  0  Down, Depressed, Hopeless 3 3 3 3  0  PHQ - 2 Score 6 6 6 6  0  Altered sleeping 3 3 3 3  -  Tired, decreased energy 3 3 3 3  -  Change in appetite 3 3 3 3  -  Feeling bad or  failure about yourself  3 3 3 3  -  Trouble concentrating 3 3 3 3  -  Moving slowly or fidgety/restless 3 3 3 3  -  Suicidal thoughts 3 3 3 3  -  PHQ-9 Score 27 27 27 27  -   GAD 7 : Generalized Anxiety Score 10/28/2016 10/07/2016 08/07/2016 07/23/2016  Nervous, Anxious, on Edge 3 3 3 3   Control/stop worrying 3 3 3 3   Worry too much - different things 3 3 3 3   Trouble relaxing 3 3 3 3   Restless 3 3 3 3   Easily annoyed or irritable 3 3 3 3   Afraid - awful might happen 3 3 3 3   Total GAD 7 Score 21 21 21 21       Assessment & Plan:  Loretta [name] was seen today for depression.  Diagnoses and all orders for this visit:  External hemorrhoids  Bipolar disorder, in full remission, most recent episode depressed (HCC)   There are no diagnoses linked to this encounter.  No orders of the defined types were placed in this encounter.   Follow-up: Return in about 6 weeks (around 12/09/2016) for hemorrhoids .   Dessa Phi MD

## 2016-10-28 NOTE — Assessment & Plan Note (Signed)
Patient picked up anal pram HC  6 tubes free, 6 month supply

## 2016-10-28 NOTE — Assessment & Plan Note (Signed)
Actively depressed This is chronic She is advised to have mental health evaluation next week She agrees

## 2016-11-03 ENCOUNTER — Encounter: Payer: Self-pay | Admitting: Surgery

## 2016-11-03 ENCOUNTER — Telehealth: Payer: Self-pay | Admitting: Family Medicine

## 2016-11-03 ENCOUNTER — Ambulatory Visit (INDEPENDENT_AMBULATORY_CARE_PROVIDER_SITE_OTHER): Payer: Self-pay | Admitting: Surgery

## 2016-11-03 VITALS — BP 138/82 | HR 78 | Temp 98.3°F | Ht 64.0 in | Wt 147.0 lb

## 2016-11-03 DIAGNOSIS — K643 Fourth degree hemorrhoids: Secondary | ICD-10-CM

## 2016-11-03 NOTE — Telephone Encounter (Signed)
Patient called the office to speak with PCP regarding her visit today with specialist. Surgeon informed patient that unfortunately he cannot perform the surgery on her, but he did refer her to another specialist. Patient is now waiting on her new appointment.   Thank you.

## 2016-11-03 NOTE — Progress Notes (Signed)
Patient ID: Loretta Blenderrie D Chavers, female   DOB: 06-11-1961, 56 y.o.   MRN: [id number]  HPI Loretta [name] is a 56 y.o. female seen in consultation for symptomatic hemorrhoids. Patient reports she has been having hemorrhoids for the last 5 years. Over the last year her symptoms have worsened. She experienced in the chart intermittent anorectal pain worsening when she passed a bowel movement. She also reports some hematochezia as well as some soilage and some discharge from the anorectal area. She was recently given the steroid cream with some minimal improvement. Her last colonoscopy was 2 years ago and with no evidence of malignancy. She has significant symptoms related to her anorectal disease is unable to exercise and to lift anything heavy. She is very frustrated. She does have a history of bipolar disease and some chronic pain.  HPI  Past Medical History:  Diagnosis Date  . Bipolar affective (HCC) 1991  . Diverticulosis of colon without diverticulitis 09/2014  . Insomnia     Past Surgical History:  Procedure Laterality Date  . APPENDECTOMY    . TUBAL LIGATION      Family History  Problem Relation Age of Onset  . Lung cancer Father   . Diabetes Father   . Heart attack Mother   . Colon cancer Neg Hx   . Colon polyps Neg Hx   . Kidney disease Neg Hx     Social History Social History  Substance Use Topics  . Smoking status: Former Smoker    Types: Cigarettes    Quit date: 02/05/2008  . Smokeless tobacco: Never Used  . Alcohol use Yes     Comment: Occassionally    Allergies  Allergen Reactions  . Codeine Itching, Nausea Only and Other (See Comments)    Pt feels like skin is crawling  . Penicillins Itching, Nausea Only and Other (See Comments)    Pt feels like skin is crawling    Current Outpatient Prescriptions  Medication Sig Dispense Refill  . cetirizine (ZYRTEC) 10 MG tablet Take 1 tablet (10 mg total) by mouth daily. 30 tablet 11  . diazepam (VALIUM) 10 MG tablet Take 1  tablet (10 mg total) by mouth at bedtime. 30 tablet 2  . diclofenac sodium (VOLTAREN) 1 % GEL Apply 2 g topically 4 (four) times daily. 100 g 3  . hydrocortisone-pramoxine (ANALPRAM-HC) 2.5-1 % rectal cream Apply 4 grams 3 times daily to per rectum 360 g 3  . Multiple Vitamins-Minerals (ALIVE WOMENS GUMMY) CHEW Chew 1 capsule by mouth 1 day or 1 dose.    . pantoprazole (PROTONIX) 40 MG tablet Take 1 tablet (40 mg total) by mouth 2 (two) times daily before a meal. 60 tablet 3   No current facility-administered medications for this visit.      Review of Systems A 10 point review of systems was asked and was negative except for the information on the HPI  Physical Exam Blood pressure 138/82, pulse 78, temperature 98.3 F (36.8 C), temperature source Oral, height 5\' 4"  (1.626 m), weight 66.7 kg (147 lb). CONSTITUTIONAL: NAD EYES: Pupils are equal, round, and reactive to light, Sclera are non-icteric. LYMPH NODES:  Lymph nodes in the neck are normal. GI: The abdomen is  soft, nontender, and nondistended. There are no palpable masses. There is no hepatosplenomegaly. There are normal bowel sounds in all quadrants. Rectal: There are grade 4 hemorrhoids on the left antero lateral , left posterolateral and right lateral columns. There is some degree of his stenosis  and the rectal exam is very uncomfortable. It is limited due to patient discomfort. I did not see a fissure.   MUSCULOSKELETAL: Normal muscle strength and tone. No cyanosis or edema.   SKIN: Turgor is good and there are no pathologic skin lesions or ulcers. NEUROLOGIC: Motor and sensation is grossly normal. Cranial nerves are grossly intact. PSYCH:  Oriented to person, place and time. Affect is normal.  Data Reviewed I have personally reviewed the patient's imaging, laboratory findings and medical records.    Assessment /Plan Severe anorectal pain from anorectal pathology. On physical exam she has obvious evidence of grade 4  hemorrhoids but more importantly there is significant stenosis of the anal canal. I would be concerned about doing just a simple hemorrhoidectomy given the fact that she is very tight. Obviously anal stenosis is very well known potential complication from hemorrhoid surgery. I discussed this with the patient and I do think that she is better served seeing a colorectal specialist. I also encouraged her to do sitz baths, high fiber and stool softener. She is taking the steroid cream and I would suggest to maybe give a trial of nifedipine in case there is a concomitant fissure as well. Apparently she does have significant financial constraints and I'm not sure if she will be able to a for her medications because she is uninsured. No need in the meantime for immediate surgical revision. We will defer her for a colorectal specialist of this complicated anorectal problem  Sterling Big, MD FACS General Surgeon 11/03/2016, 11:37 AM

## 2016-11-03 NOTE — Patient Instructions (Addendum)
We will send [facility] to [facility] with Dr. Romie LeveeAlicia Thomas. Once its scheduled, we call you to let you know.   Their number is the following: [facility], [address] [place], [place] [address] [phone]  If you do not hear from us in the next 5 business days, please give us a call to ask about your referral.

## 2016-11-03 NOTE — Telephone Encounter (Signed)
Will route to PCP 

## 2016-11-06 NOTE — Telephone Encounter (Signed)
Called back Left VM Informed patient that I have reviewed her note from gen surgeon Dr. Everlene FarrierPabon who has referred her to colorectal specialist, Dr. Romie LeveeAlicia [name]. Awaiting appt.  nifedipine cream was mentioned for possible concomitant anal fissure. Financials restraints prohibit this at the moment.  Advised continue use of analpram-HC Wait for appt with Dr. Miquel Dunnhomans

## 2016-11-07 ENCOUNTER — Telehealth: Payer: Self-pay | Admitting: Surgery

## 2016-11-07 NOTE — Telephone Encounter (Signed)
I have faxed a referral to [facility] for external and internal hemorrhoids per Dr Everlene FarrierPabon.   FAX# [phone] [facility] [phone]  Once physician reviews chart, [facility] will contact patient to make an appointment. This should take within 5-7 business days. I will follow up to make sure appointment is made in a timely manner.

## 2016-11-10 ENCOUNTER — Encounter: Payer: Self-pay | Admitting: Gastroenterology

## 2016-11-10 ENCOUNTER — Ambulatory Visit (INDEPENDENT_AMBULATORY_CARE_PROVIDER_SITE_OTHER): Payer: Self-pay | Admitting: Gastroenterology

## 2016-11-10 VITALS — BP 100/72 | HR 64 | Ht 64.5 in | Wt 147.0 lb

## 2016-11-10 DIAGNOSIS — K602 Anal fissure, unspecified: Secondary | ICD-10-CM

## 2016-11-10 DIAGNOSIS — K219 Gastro-esophageal reflux disease without esophagitis: Secondary | ICD-10-CM

## 2016-11-10 DIAGNOSIS — K625 Hemorrhage of anus and rectum: Secondary | ICD-10-CM

## 2016-11-10 MED ORDER — AMBULATORY NON FORMULARY MEDICATION: 1 refills | Status: DC

## 2016-11-10 MED FILL — VOLTAREN 1% GEL: 1 | 30 days supply | Qty: 100 | Fill #2

## 2016-11-10 NOTE — Progress Notes (Signed)
Loretta [name] [facility] Consult Note:  History: Loretta [name] 11/10/2016  Referring physician: Lora [name], Loretta  Reason for consult/chief complaint: Diverticulitis (pt c/o abdominal cramping and internal and external hemorrhoids.  Needs medication for diverticulitis)   Subjective  HPI:  This is a 56 year old woman last seen by Dr. Arlyce Dice in October 2015. [name] that point, she had a variety of digestive symptoms including dyspepsia, altered bowel habits and rectal pain with bleeding. Upper endoscopy was unremarkable, colonoscopy had mild sigmoid diverticulosis and internal hemorrhoids. She was prescribed hyoscyamine and she recalls being told she needed hemorrhoid surgery. Shortly after that, she lost her job and her insurance and has been unable to get care for this condition since then. She is a rather tangential historian with a multitude of symptoms. She is very anxious and tearful during the exam she has pressured speech [name] times. Her primary symptoms seems to be anorectal pain with bleeding that occurs multiple times every day. She has difficulty with bowel movements, sometimes awakening in the early a.m. with crampy lower abdominal pain and feeling the need for a BM, but cannot do so. She has been taking quite a lot of fiber supplements in hopes of improving her bowel movements. She says when there is rectal pain she then has pain and burning that rises to her entire abdomen, up through chest and into her throat. She seems to deny dysphagia or weight loss, but has intermittent nausea. She is fixated on having hemorrhoidal surgery and says that she had an office visit with the surgeon in [place] sometime last week. She recalls being told that she had "scar tissue" and that she needed the expertise of a colorectal surgeon. She is apparently set up with an appointment to see such a Careers adviser [name] in a month.  ROS:  Review of Systems  Constitutional: Negative for appetite  change and unexpected weight change.  HENT: Negative for mouth sores and voice change.   Eyes: Negative for pain and redness.  Respiratory: Negative for cough and shortness of breath.   Cardiovascular: Negative for chest pain and palpitations.  Genitourinary: Negative for dysuria and hematuria.  Musculoskeletal: Negative for arthralgias and myalgias.  Skin: Negative for pallor and rash.  Neurological: Negative for weakness and headaches.  Hematological: Negative for adenopathy.  Psychiatric/Behavioral: The patient is nervous/anxious.      Past Medical History: Past Medical History:  Diagnosis Date  . Bipolar affective (HCC) 1991  . Diverticulosis of colon without diverticulitis 09/2014  . Insomnia      Past Surgical History: Past Surgical History:  Procedure Laterality Date  . APPENDECTOMY    . TUBAL LIGATION       Family History: Family History  Problem Relation Age of Onset  . Lung cancer Father   . Diabetes Father   . Heart attack Mother   . Colon cancer Neg Hx   . Colon polyps Neg Hx   . Kidney disease Neg Hx     Social History: Social History   Social History  . Marital status: Divorced    Spouse name: N/A  . Number of children: 5  . Years of education: N/A   Occupational History  . 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 Smoking status: Former Smoker    Types: Cigarettes    Quit date: 02/05/2008  . Smokeless tobacco: Never Used  . Alcohol use Yes     Comment: Occassionally  . Drug use: Yes  Types: Marijuana     Comment: Every day for insomnia; relax to sleep  . Sexual activity: Not Asked   Other Topics Concern  . None   Social History Narrative  . None    Allergies: Allergies  Allergen Reactions  . Codeine Itching, Nausea Only and Other (See Comments)    Pt feels like skin is crawling  . Penicillins Itching, Nausea Only and Other (See Comments)    Pt feels like skin is crawling    Outpatient Meds: Current  Outpatient Prescriptions  Medication Sig Dispense Refill  . cetirizine (ZYRTEC) 10 MG tablet Take 1 tablet (10 mg total) by mouth daily. 30 tablet 11  . diazepam (VALIUM) 10 MG tablet Take 1 tablet (10 mg total) by mouth [name] bedtime. 30 tablet 2  . diclofenac sodium (VOLTAREN) 1 % GEL Apply 2 g topically 4 (four) times daily. 100 g 3  . hydrocortisone-pramoxine (ANALPRAM-HC) 2.5-1 % rectal cream Apply 4 grams 3 times daily to per rectum 360 g 3  . Multiple Vitamins-Minerals (ALIVE WOMENS GUMMY) CHEW Chew 1 capsule by mouth 1 day or 1 dose.    . pantoprazole (PROTONIX) 40 MG tablet Take 1 tablet (40 mg total) by mouth 2 (two) times daily before a meal. 60 tablet 3  . AMBULATORY NON FORMULARY MEDICATION Nitroglycerine ointment 0.125 %  Apply a pea sized amount internally four times daily. Dispense 30 GM zero refill 280 mL 1   No current facility-administered medications for this visit.       ___________________________________________________________________ Objective   Exam:  BP 100/72   Pulse 64   Ht 5' 4.5" (1.638 m)   Wt 147 lb (66.7 kg)   BMI 24.84 kg/m  Anxious and tearful Entire exam chaperoned by our MA Toni  General: this is a(n) woman who looks older than her stated age, good muscle mass   Eyes: sclera anicteric, no redness  ENT: oral mucosa moist without lesions, no cervical or supraclavicular lymphadenopathy, good dentition  CV: RRR without murmur, S1/S2, no JVD, no peripheral edema  Resp: clear to auscultation bilaterally, normal RR and effort noted  GI: soft, no tenderness, with active bowel sounds. No guarding or palpable organomegaly noted.  Skin; warm and dry, no rash or jaundice noted  Neuro: awake, alert and oriented x 3. Normal gross motor function and fluent speech Rectal: Redundant anal tissue, incomplete exam due to severe tenderness of the posterior anal canal. Labs:  CBC Latest Ref Rng & Units 10/07/2016 07/23/2016 07/10/2016  WBC 3.8 - 10.8 K/uL  7.0 10.9(H) 8.4  Hemoglobin 11.7 - 15.5 g/dL 16.1 09.6 04.5  Hematocrit 35.0 - 45.0 % 41.8 38.8 41.0  Platelets 140 - 400 K/uL 348 336 270   Ferritin 37 one month ago   Assessment: Encounter Diagnoses  Name Primary?  Marland Kitchen Anal fissure Yes  . Rectal bleeding   . 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 There seems to be a functional component to the remainder of her GI symptoms.  Plan:    Recticare ointment 3 times a day and as needed 0.125% nitroglycerin ointment local compounding pharmacy, 3 times a day. Instructions to apply these ointments were given. Stop fiber supplement Again MiraLAX one capful 2-3 times a day for soft stools  Keep upcoming surgical appointment a she may yet need an exam under anesthesia to rule out  other pathology and possibly surgical management of this fissure.  Total 30 minute visit, over half spent reviewing records, counseling and coordinating care.  Charlie PitterHenry L Danis III  CC: Loretta PaulaFUNCHES, JOSALYN C, Loretta

## 2016-11-10 NOTE — Patient Instructions (Addendum)
Patient Drug Education for Nitroglycerin Ointment  Nitroglycerin ointment (NTG) is used to help heal anal fissures. The ointment relaxes the smooth muscle around the anus and promotes blood flow which helps heal the fissure (tear). The NTG reduces anal canal pressure, which diminishes pain and spasm. We use a diluted concentration of NTG (.125%) compared to the 2% that is typically used for heart patients, and this is why you need to obtain the medication from a pharmacy which will compound your prescription.  The NTG ointment should be applied 3 times per day, or as directed.  A pea-sized drop should be placed on the tip of your finger and then gently placed inside the anus. The finger should be inserted 1/3 - 1/2 its length and may be covered with a plastic glove or finger cot. You may use Vaseline to help coat the finger or dilute the ointment. If you are advised to mix the NTG with steroid ointment, limit the steroids to one to two weeks.  The first few applications should be taken lying down, as mild light-headedness or a brief headache may occur.  It may take several weeks for the fissure to begin healing, and you will need to continue taking the medication after resolution of your symptoms.  It is important to continue the treatment for the entire time period - up to 3 months or as directed. It takes up to two years for the healing tissue to regain the normal skin strength. You will be advised to add fiber to your diet, increase water intake to 7-8 glasses per day, take relaxing baths or sitz baths, and avoid prolonged sitting and straining on the commode. Local anesthetic ointment may be added.  Initially, the anal fissure is very inflamed, which allows more of the NTG to get into the blood. This allows for a higher incidence of the most common side effect - a headache. It is usually brief and mild, but may require Tylenol or Advil. You may dilute the NTG further with Vaseline to decrease the  headaches. As the treatment progresses and the fissure begins to heal, the headaches will tend to dissipate. Other side effects include lightheadedness, flushing, dizziness, nervousness, nausea, and vomiting. If any of these side effects persist or worsen, notify us promptly. Stop using the NTG and notify us immediately if you develop the rare side effects of severe dizziness, fainting, fast/pounding heartbeat, paleness, sweating, blurred vision, dry mouth, dark urine, bluish lips/skin/nails, unusual tiredness, severe weakness, irregular heartbeat, seizures, or chest pain. Serious allergic reactions are unusual, but seek immediate medical attention if you develop a rash, swelling, dizziness, or trouble breathing.  Tell us if you are allergic to nitrates, have severe anemia, low blood pressure, dehydration, chronic heart failure, cardiomyopathy, recent heart attack, increased pressure in the brain, or exposure to nitrates while on the job. Do not use NTG while driving or working around machinery if you are drowsy, dizzy, have lightheadedness, or blurred vision. Limit alcoholic beverages. To minimize dizziness and lightheadedness, get up slowly when rising from a sitting or lying position. The elderly may be more prone to dizziness and falling. While there are not adequate studies to confirm the safety of NTG in pregnant or breast feeding women, it has been used without incident so far. We recommend waiting at least one hour after applying the NTG ointment before breast feeding.  Do not use NTG ointment if you are taking drugs for sexual problems [e.g., sildenafil (Viagra), tadalafil (Cialis), vardenafil (Levitra)]. Use caution  before taking cough-and-cold products, diet aids, or NSAIDs preparations because they may contain ingredients that could increase your blood pressure, cause a fast heartbeat, or increase chest pain (e.g., pseudoephedrine, phenylephrine, chlorpheniramine, diphenhydramine, clemastine,  ibuprofen, and naproxen). Tell us if you drink alcohol, take alteplase, migraine drugs (ergotamine), water pills/diuretics such as furosemide or hydrochlorothiazide, or other drugs for high blood pressure (beta blockers, calcium channel blockers, ACE inhibitors).  Store the NTG at room temperature and keep away from light and moisture. Close the container tightly after each use. Do not store in the bathroom. Keep away from children and pets. If you have any questions or problems please call us at [phone].  Please purchase Recticare over the counter and use as directed. Use this instead of the Analpram  Start Miralax 1 capful a day.  STOP fiber supplements.  If you are age 1 or older, your body mass index should be between 23-30. Your Body mass index is 24.84 kg/m. If this is out of the aforementioned range listed, please consider follow up with your Primary Care Provider.  If you are age 61 or younger, your body mass index should be between 19-25. Your Body mass index is 24.84 kg/m. If this is out of the aformentioned range listed, please consider follow up with your Primary Care Provider.   We have sent the following medications to your pharmacy for you to pick up at your convenience: Nitro ointment. This was sent to [facility].  Thank you for choosing [facility]  Dr Amada Jupiter III

## 2016-11-11 NOTE — Telephone Encounter (Signed)
[facility]  12/18/16 with Dr Raye SorrowNicole [facility] [address] [place].   Patient is aware of appointment.

## 2016-11-21 MED FILL — ?PANTOPRAZOLE SOD DR 40MG: 40 MG | 30 days supply | Qty: 60 | Fill #3

## 2016-12-03 ENCOUNTER — Other Ambulatory Visit: Payer: Self-pay | Admitting: Family Medicine

## 2016-12-03 DIAGNOSIS — Z1231 Encounter for screening mammogram for malignant neoplasm of breast: Secondary | ICD-10-CM

## 2016-12-23 ENCOUNTER — Telehealth: Payer: Self-pay | Admitting: Family Medicine

## 2016-12-23 ENCOUNTER — Ambulatory Visit
Admission: RE | Admit: 2016-12-23 | Discharge: 2016-12-23 | Disposition: A | Discharge status: Home / Self Care | Payer: No Typology Code available for payment source | Source: Ambulatory Visit | Attending: Family Medicine | Admitting: Family Medicine

## 2016-12-23 ENCOUNTER — Other Ambulatory Visit: Payer: Self-pay | Admitting: Family Medicine

## 2016-12-23 DIAGNOSIS — R1013 Epigastric pain: Principal | ICD-10-CM

## 2016-12-23 DIAGNOSIS — Z1231 Encounter for screening mammogram for malignant neoplasm of breast: Secondary | ICD-10-CM

## 2016-12-23 DIAGNOSIS — F3176 Bipolar disorder, in full remission, most recent episode depressed: Secondary | ICD-10-CM

## 2016-12-23 DIAGNOSIS — G8929 Other chronic pain: Secondary | ICD-10-CM

## 2016-12-23 MED ORDER — PANTOPRAZOLE SODIUM 40 MG PO TBEC: 40.0000 mg | DELAYED_RELEASE_TABLET | Freq: Two times a day (BID) | ORAL | 3 refills | Status: DC

## 2016-12-23 MED ORDER — DIAZEPAM 10 MG PO TABS: 10.0000 mg | ORAL_TABLET | Freq: Every day | ORAL | 2 refills | Status: DC

## 2016-12-23 NOTE — Telephone Encounter (Signed)
Pt was called and informed of script being ready for pick up.

## 2016-12-23 NOTE — Telephone Encounter (Signed)
Valium refill ready for pick up Please inform patient

## 2016-12-23 NOTE — Telephone Encounter (Signed)
Pt came in requesting a refill for her Valium. Please f/u.

## 2017-01-05 MED FILL — ?PANTOPRAZOLE SOD DR 40MG: 40 MG | 30 days supply | Qty: 60 | Fill #1

## 2017-01-08 ENCOUNTER — Telehealth: Payer: Self-pay | Admitting: Family Medicine

## 2017-01-08 DIAGNOSIS — G8929 Other chronic pain: Secondary | ICD-10-CM

## 2017-01-08 DIAGNOSIS — R1013 Epigastric pain: Principal | ICD-10-CM

## 2017-01-08 MED ORDER — PANTOPRAZOLE SODIUM 40 MG PO TBEC: 40.0000 mg | DELAYED_RELEASE_TABLET | Freq: Two times a day (BID) | ORAL | 3 refills | Status: DC

## 2017-01-08 NOTE — Telephone Encounter (Signed)
Will route to PCP 

## 2017-01-08 NOTE — Telephone Encounter (Signed)
Called patient, verified name and DOB That's great news She still has pain and bleeding She request refill on Protonix sent to onsite pharmacy Protonix refilled

## 2017-01-08 NOTE — Telephone Encounter (Signed)
Patient calling to inform PCP that an appointment has been scheduled on April 18th with Dr. Raye Sorrow, with [facility], to remove internal and external hemorrhoids

## 2017-01-21 HISTORY — PX: HEMORROIDECTOMY: SUR656

## 2017-02-18 ENCOUNTER — Encounter: Payer: Self-pay | Admitting: Family Medicine

## 2017-03-17 ENCOUNTER — Ambulatory Visit: Payer: Self-pay | Attending: Family Medicine | Admitting: Family Medicine

## 2017-03-17 ENCOUNTER — Encounter: Payer: Self-pay | Admitting: Family Medicine

## 2017-03-17 VITALS — BP 131/84 | HR 64 | Temp 98.3°F | Wt 146.6 lb

## 2017-03-17 DIAGNOSIS — B9689 Other specified bacterial agents as the cause of diseases classified elsewhere: Secondary | ICD-10-CM

## 2017-03-17 DIAGNOSIS — G4700 Insomnia, unspecified: Secondary | ICD-10-CM | POA: Insufficient documentation

## 2017-03-17 DIAGNOSIS — Z87891 Personal history of nicotine dependence: Secondary | ICD-10-CM | POA: Insufficient documentation

## 2017-03-17 DIAGNOSIS — F3176 Bipolar disorder, in full remission, most recent episode depressed: Secondary | ICD-10-CM | POA: Insufficient documentation

## 2017-03-17 DIAGNOSIS — M19049 Primary osteoarthritis, unspecified hand: Secondary | ICD-10-CM | POA: Insufficient documentation

## 2017-03-17 DIAGNOSIS — Z9889 Other specified postprocedural states: Secondary | ICD-10-CM | POA: Insufficient documentation

## 2017-03-17 DIAGNOSIS — Z79899 Other long term (current) drug therapy: Secondary | ICD-10-CM | POA: Insufficient documentation

## 2017-03-17 DIAGNOSIS — N760 Acute vaginitis: Secondary | ICD-10-CM

## 2017-03-17 DIAGNOSIS — N898 Other specified noninflammatory disorders of vagina: Secondary | ICD-10-CM | POA: Insufficient documentation

## 2017-03-17 DIAGNOSIS — K644 Residual hemorrhoidal skin tags: Secondary | ICD-10-CM

## 2017-03-17 MED ORDER — DIAZEPAM 10 MG PO TABS: 10.0000 mg | ORAL_TABLET | Freq: Every day | ORAL | 5 refills | Status: DC

## 2017-03-17 MED ORDER — DICLOFENAC SODIUM 1 % TD GEL: 2.0000 g | Freq: Four times a day (QID) | TRANSDERMAL | 5 refills | Status: DC

## 2017-03-17 MED FILL — VOLTAREN 1% GEL: 1 | 12 days supply | Qty: 100 | Fill #0

## 2017-03-17 NOTE — Assessment & Plan Note (Signed)
Resolved s/p hemorrhoidectomy

## 2017-03-17 NOTE — Progress Notes (Signed)
Subjective:  Patient ID: Loretta [name], female    DOB: 1961-06-07  Age: 56 y.o. MRN: [id number]  CC: Insomnia   HPI Roderic Palauerrie D Stollings presents for   1. Bipolar depression: since age 56. Declined. Feeling fatigue and depressed. Tearful. Taking valium for anxiety symptoms. Has been on Remeron and zoloft in the past. Not currently under the care of a mental health provider. She reports her mood is doing great since her hemorrhoid surgery. She request valium refill.   2. Hemorrhoids: she is s/p [facility] She denies rectal pain. Drainage stopped one month ago. She reports all of her abdominal pains and nausea have resolved.   3. Vaginal discharge: with odor. For the past month. No itching or irritation. No rash.   Social History  Substance Use Topics  . Smoking status: Former Smoker    Types: Cigarettes    Quit date: 02/05/2008  . Smokeless tobacco: Never Used  . Alcohol use Yes     Comment: Occassionally   Past Surgical History:  Procedure Laterality Date  . APPENDECTOMY    . TUBAL LIGATION     Outpatient Medications Prior to Visit  Medication Sig Dispense Refill  . AMBULATORY NON FORMULARY MEDICATION Nitroglycerine ointment 0.125 %  Apply a pea sized amount internally four times daily. Dispense 30 GM zero refill 280 mL 1  . cetirizine (ZYRTEC) 10 MG tablet Take 1 tablet (10 mg total) by mouth daily. 30 tablet 11  . diazepam (VALIUM) 10 MG tablet Take 1 tablet (10 mg total) by mouth at bedtime. 30 tablet 2  . diclofenac sodium (VOLTAREN) 1 % GEL Apply 2 g topically 4 (four) times daily. 100 g 3  . Multiple Vitamins-Minerals (ALIVE WOMENS GUMMY) CHEW Chew 1 capsule by mouth 1 day or 1 dose.    . hydrocortisone-pramoxine (ANALPRAM-HC) 2.5-1 % rectal cream Apply 4 grams 3 times daily to per rectum (Patient not taking: Reported on 03/17/2017) 360 g 3  . pantoprazole (PROTONIX) 40 MG tablet Take 1 tablet (40 mg total) by mouth 2 (two) times  daily before a meal. (Patient not taking: Reported on 03/17/2017) 60 tablet 3   No facility-administered medications prior to visit.     ROS Review of Systems  Constitutional: Negative for chills, fatigue and fever.  HENT: Negative for congestion, ear pain, sinus pain, sinus pressure and sore throat.   Eyes: Negative for visual disturbance.  Respiratory: Negative for cough and shortness of breath.   Cardiovascular: Negative for chest pain.  Gastrointestinal: Negative for abdominal pain, blood in stool, diarrhea and rectal pain.  Genitourinary: Positive for vaginal discharge.  Musculoskeletal: Positive for arthralgias. Negative for back pain and joint swelling.  Skin: Negative for rash.  Allergic/Immunologic: Negative for immunocompromised state.  Hematological: Negative for adenopathy. Does not bruise/bleed easily.  Psychiatric/Behavioral: Negative for dysphoric mood, sleep disturbance and suicidal ideas.    Objective:  BP 131/84   Pulse 64   Temp 98.3 F (36.8 C) (Oral)   Wt 146 lb 9.6 oz (66.5 kg)   SpO2 98%   BMI 24.78 kg/m   BP/Weight 03/17/2017 11/10/2016 11/03/2016  Systolic BP 131 100 138  Diastolic BP 84 72 82  Wt. (Lbs) 146.6 147 147  BMI 24.78 24.84 25.23   Physical Exam  Constitutional: She is oriented to person, place, and time. She appears well-developed and well-nourished. No distress.  HENT:  Head: Normocephalic and atraumatic.  Pulmonary/Chest: Effort normal.  Genitourinary: Uterus normal. Pelvic  exam was performed with patient prone. There is no rash, tenderness or lesion on the right labia. There is no rash, tenderness or lesion on the left labia. Cervix exhibits no motion tenderness, no discharge and no friability. Vaginal discharge (scant thin vaginal discharge) found.  Musculoskeletal: She exhibits no edema.  Lymphadenopathy:       Right: No inguinal adenopathy present.       Left: No inguinal adenopathy present.  Neurological: She is alert and oriented  to person, place, and time.  Skin: Skin is warm and dry. No rash noted.  Psychiatric: She has a normal mood and affect.   Depression screen [facility] 2/9 10/28/2016 10/07/2016 08/07/2016 07/23/2016 07/10/2016  Decreased Interest 3 3 3 3  0  Down, Depressed, Hopeless 3 3 3 3  0  PHQ - 2 Score 6 6 6 6  0  Altered sleeping 3 3 3 3  -  Tired, decreased energy 3 3 3 3  -  Change in appetite 3 3 3 3  -  Feeling bad or failure about yourself  3 3 3 3  -  Trouble concentrating 3 3 3 3  -  Moving slowly or fidgety/restless 3 3 3 3  -  Suicidal thoughts 3 3 3 3  -  PHQ-9 Score 27 27 27 27  -   GAD 7 : Generalized Anxiety Score 10/28/2016 10/07/2016 08/07/2016 07/23/2016  Nervous, Anxious, on Edge 3 3 3 3   Control/stop worrying 3 3 3 3   Worry too much - different things 3 3 3 3   Trouble relaxing 3 3 3 3   Restless 3 3 3 3   Easily annoyed or irritable 3 3 3 3   Afraid - awful might happen 3 3 3 3   Total GAD 7 Score 21 21 21 21       Assessment & Plan:  Tila was seen today for insomnia.  Diagnoses and all orders for this visit:  Vaginal odor -     Cervicovaginal ancillary only  Hand arthritis -     Discontinue: diclofenac sodium (VOLTAREN) 1 % GEL; Apply 2 g topically 4 (four) times daily. -     diclofenac sodium (VOLTAREN) 1 % GEL; Apply 2 g topically 4 (four) times daily.  Bipolar disorder, in full remission, most recent episode depressed (HCC) -     diazepam (VALIUM) 10 MG tablet; Take 1 tablet (10 mg total) by mouth at bedtime.   There are no diagnoses linked to this encounter.  No orders of the defined types were placed in this encounter.   Follow-up: Return in about 3 months (around 06/17/2017) for meet PCP.   Dessa Phi MD

## 2017-03-17 NOTE — Assessment & Plan Note (Signed)
Refilled Voltaren gel

## 2017-03-17 NOTE — Assessment & Plan Note (Signed)
Normal pelvic exam Reassurance and wet prep done

## 2017-03-17 NOTE — Patient Instructions (Signed)
Loretta [name] was seen today for insomnia.  Diagnoses and all orders for this visit:  Vaginal odor -     Cervicovaginal ancillary only  Hand arthritis -     diclofenac sodium (VOLTAREN) 1 % GEL; Apply 2 g topically 4 (four) times daily.  Bipolar disorder, in full remission, most recent episode depressed (HCC) -     diazepam (VALIUM) 10 MG tablet; Take 1 tablet (10 mg total) by mouth at bedtime.  you will be called with lab results  F/u in 3 months to meet new PCP, sooner if needed  Dr. Armen PickupFunches

## 2017-03-18 LAB — CERVICOVAGINAL ANCILLARY ONLY: WET PREP (BD AFFIRM): POSITIVE — AB

## 2017-03-18 MED ORDER — METRONIDAZOLE 0.75 % VA GEL: 1.0000 | Freq: Two times a day (BID) | VAGINAL | 0 refills | Status: DC

## 2017-03-18 MED FILL — VANDAZOLE VAGINAL 0.75% GEL: 0.75 | 5 days supply | Qty: 70 | Fill #0

## 2017-03-18 NOTE — Addendum Note (Signed)
Addended by: Dessa PhiFUNCHES, Nameer Summer on: 03/18/2017 02:40 PM   Modules accepted: Orders

## 2017-03-25 ENCOUNTER — Telehealth: Payer: Self-pay

## 2017-03-25 ENCOUNTER — Other Ambulatory Visit: Payer: Self-pay

## 2017-03-25 MED ORDER — TRIAMCINOLONE ACETONIDE 0.1 % EX CREA: 1.0000 "application " | TOPICAL_CREAM | Freq: Two times a day (BID) | CUTANEOUS | 0 refills | Status: DC

## 2017-03-25 NOTE — Telephone Encounter (Signed)
Pt was called and informed of lab results. 

## 2017-03-30 ENCOUNTER — Other Ambulatory Visit: Payer: Self-pay

## 2017-03-30 MED ORDER — TRIAMCINOLONE ACETONIDE 0.1 % EX CREA: 1.0000 "application " | TOPICAL_CREAM | Freq: Two times a day (BID) | CUTANEOUS | 0 refills | Status: DC

## 2017-06-19 ENCOUNTER — Ambulatory Visit: Payer: Self-pay | Admitting: Internal Medicine

## 2017-07-15 ENCOUNTER — Encounter: Payer: Self-pay | Admitting: Family Medicine

## 2017-07-15 ENCOUNTER — Ambulatory Visit: Payer: Self-pay | Attending: Internal Medicine | Admitting: Family Medicine

## 2017-07-15 VITALS — BP 108/76 | HR 78 | Temp 97.8°F | Resp 18 | Ht 65.0 in | Wt 141.4 lb

## 2017-07-15 DIAGNOSIS — M19042 Primary osteoarthritis, left hand: Secondary | ICD-10-CM | POA: Insufficient documentation

## 2017-07-15 DIAGNOSIS — Z79899 Other long term (current) drug therapy: Secondary | ICD-10-CM | POA: Insufficient documentation

## 2017-07-15 DIAGNOSIS — Z760 Encounter for issue of repeat prescription: Secondary | ICD-10-CM | POA: Insufficient documentation

## 2017-07-15 DIAGNOSIS — F3176 Bipolar disorder, in full remission, most recent episode depressed: Secondary | ICD-10-CM | POA: Insufficient documentation

## 2017-07-15 DIAGNOSIS — J302 Other seasonal allergic rhinitis: Secondary | ICD-10-CM | POA: Insufficient documentation

## 2017-07-15 DIAGNOSIS — Z9889 Other specified postprocedural states: Secondary | ICD-10-CM | POA: Insufficient documentation

## 2017-07-15 DIAGNOSIS — R0981 Nasal congestion: Secondary | ICD-10-CM | POA: Insufficient documentation

## 2017-07-15 DIAGNOSIS — M19041 Primary osteoarthritis, right hand: Secondary | ICD-10-CM | POA: Insufficient documentation

## 2017-07-15 DIAGNOSIS — M19049 Primary osteoarthritis, unspecified hand: Secondary | ICD-10-CM

## 2017-07-15 DIAGNOSIS — J3489 Other specified disorders of nose and nasal sinuses: Secondary | ICD-10-CM | POA: Insufficient documentation

## 2017-07-15 DIAGNOSIS — G4700 Insomnia, unspecified: Secondary | ICD-10-CM | POA: Insufficient documentation

## 2017-07-15 MED ORDER — DICLOFENAC SODIUM 1 % TD GEL: 2.0000 g | Freq: Four times a day (QID) | TRANSDERMAL | 5 refills | Status: DC

## 2017-07-15 MED ORDER — DIAZEPAM 10 MG PO TABS: 10.0000 mg | ORAL_TABLET | Freq: Every day | ORAL | 5 refills | Status: DC

## 2017-07-15 MED ORDER — FLUTICASONE PROPIONATE 50 MCG/ACT NA SUSP: 2.0000 | Freq: Every day | NASAL | 6 refills | Status: DC

## 2017-07-15 MED ORDER — CETIRIZINE HCL 10 MG PO TABS: 10.0000 mg | ORAL_TABLET | Freq: Every day | ORAL | 11 refills | Status: DC

## 2017-07-15 MED FILL — FLUTICASONE PROP 50 MCG SPR: 50 | 30 days supply | Qty: 16 | Fill #0

## 2017-07-15 MED FILL — VOLTAREN 1% GEL: 1 | 12 days supply | Qty: 100 | Fill #0

## 2017-07-15 MED FILL — ?CETIRIZINE HCL 10 MG TABLE: 10 | 30 days supply | Qty: 30 | Fill #0

## 2017-07-15 NOTE — Patient Instructions (Signed)
Arthritis Arthritis means joint pain. It can also mean joint disease. A joint is a place where bones come together. People who have arthritis may have:  Red joints.  Swollen joints.  Stiff joints.  Warm joints.  A fever.  A feeling of being sick.  Follow these instructions at home: Pay attention to any changes in your symptoms. Take these actions to help with your pain and swelling. Medicines  Take over-the-counter and prescription medicines only as told by your doctor.  Do not take aspirin for pain if your doctor says that you may have gout. Activity  Rest your joint if your doctor tells you to.  Avoid activities that make the pain worse.  Exercise your joint regularly as told by your doctor. Try doing exercises like: ? Swimming. ? Water aerobics. ? Biking. ? Walking. Joint Care   If your joint is swollen, keep it raised (elevated) if told by your doctor.  If your joint feels stiff in the morning, try taking a warm shower.  If you have diabetes, do not apply heat without asking your doctor.  If told, apply heat to the joint: ? Put a towel between the joint and the hot pack or heating pad. ? Leave the heat on the area for 20-30 minutes.  If told, apply ice to the joint: ? Put ice in a plastic bag. ? Place a towel between your skin and the bag. ? Leave the ice on for 20 minutes, 2-3 times per day.  Keep all follow-up visits as told by your doctor. Contact a doctor if:  The pain gets worse.  You have a fever. Get help right away if:  You have very bad pain in your joint.  You have swelling in your joint.  Your joint is red.  Many joints become painful and swollen.  You have very bad back pain.  Your leg is very weak.  You cannot control your pee (urine) or poop (stool). This information is not intended to replace advice given to you by your health care provider. Make sure you discuss any questions you have with your health care provider. Document  Released: 12/17/2009 Document Revised: 02/28/2016 Document Reviewed: 12/18/2014 Elsevier Interactive Patient Education  2018 [employer]  

## 2017-07-15 NOTE — Progress Notes (Signed)
Subjective:  Patient ID: Loretta [name], female    DOB: 01-Aug-1961  Age: 56 y.o. MRN: [id number]  CC: Establish Care   HPI Loretta [name] presents to re-establish care and for follow up. PMH  bipolar disorder, arthritis, and had hemorrhoidectomy. History of bipolar disorder. She denies any dysphagia she reports adherence with Valium use reports receiving counseling resources in the past however she discontinued, she is not interested in restarting counseling services at this time. Reports using Valium for over 1 year with symptoms of bipolar disorder and insomnia .History of arthritis of bilateral hands. She reports taken Voltaren gel for symptoms. History of hemorrhoidectomy at [facility] 01/21/2017. She reports relief of symptoms with use. She denies any abdominal pain, bloody stool, or weight loss. She complains of nasal congestion onset of symptoms 3 weeks ago. She reports taking over-the-counter cold medication for symptoms with minimal relief.    Outpatient Medications Prior to Visit  Medication Sig Dispense Refill  . AMBULATORY NON FORMULARY MEDICATION Nitroglycerine ointment 0.125 %  Apply a pea sized amount internally four times daily. Dispense 30 GM zero refill 280 mL 1  . metroNIDAZOLE (METROGEL VAGINAL) 0.75 % vaginal gel Place 1 Applicatorful vaginally 2 (two) times daily. For 5 days 70 g 0  . Multiple Vitamins-Minerals (ALIVE WOMENS GUMMY) CHEW Chew 1 capsule by mouth 1 day or 1 dose.    . triamcinolone cream (KENALOG) 0.1 % Apply 1 application topically 2 (two) times daily. 30 g 0  . cetirizine (ZYRTEC) 10 MG tablet Take 1 tablet (10 mg total) by mouth daily. 30 tablet 11  . diazepam (VALIUM) 10 MG tablet Take 1 tablet (10 mg total) by mouth at bedtime. 30 tablet 5  . diclofenac sodium (VOLTAREN) 1 % GEL Apply 2 g topically 4 (four) times daily. 100 g 5   No facility-administered medications prior to visit.     ROS Review of Systems  Constitutional: Negative.   HENT: Positive  for congestion and rhinorrhea.   Respiratory: Negative.   Cardiovascular: Negative.   Gastrointestinal: Negative.   Musculoskeletal: Positive for arthralgias (chronic).  Psychiatric/Behavioral: Negative for suicidal ideas.       History of bipolar disorder.      Objective:  BP 108/76 (BP Location: Left Arm, Patient Position: Sitting, Cuff Size: Normal)   Pulse 78   Temp 97.8 F (36.6 C) (Oral)   Resp 18   Ht  (1.651 m)   Wt 141 lb 6.4 oz (64.1 kg)   SpO2 96%   BMI 23.53 kg/m   BP/Weight 07/15/2017 03/17/2017 11/10/2016  Systolic BP 108 131 100  Diastolic BP 76 84 72  Wt. (Lbs) 141.4 146.6 147  BMI 23.53 24.78 24.84     Physical Exam  Constitutional: She appears well-developed and well-nourished.  HENT:  Head: Normocephalic and atraumatic.  Right Ear: External ear normal.  Left Ear: External ear normal.  Nose: Nose normal.  Mouth/Throat: Oropharynx is clear and moist.  Eyes: Pupils are equal, round, and reactive to light. Conjunctivae are normal.  Neck: No JVD present.  Cardiovascular: Normal rate, regular rhythm, normal heart sounds and intact distal pulses.   Pulmonary/Chest: Effort normal and breath sounds normal.  Abdominal: Soft. Bowel sounds are normal. There is no tenderness.  Musculoskeletal: She exhibits no tenderness.  Skin: Skin is warm and dry.  Psychiatric: She expresses no homicidal and no suicidal ideation. She expresses no suicidal plans and no homicidal plans.  Nursing note and vitals reviewed.  Assessment & Plan:   1. Bipolar disorder, in full remission, most recent episode depressed (HCC)  - diazepam (VALIUM) 10 MG tablet; Take 1 tablet (10 mg total) by mouth at bedtime.  Dispense: 30 tablet; Refill: 5  2. Hand arthritis  - diclofenac sodium (VOLTAREN) 1 % GEL; Apply 2 g topically 4 (four) times daily.  Dispense: 100 g; Refill: 5  3. Nasal congestion with rhinorrhea  - fluticasone (FLONASE) 50 MCG/ACT nasal spray; Place 2 sprays  into both nostrils daily.  Dispense: 16 g; Refill: 6  4. Medication refill  - cetirizine (ZYRTEC) 10 MG tablet; Take 1 tablet (10 mg total) by mouth daily.  Dispense: 30 tablet; Refill: 11   Meds ordered this encounter  Medications  . diazepam (VALIUM) 10 MG tablet    Sig: Take 1 tablet (10 mg total) by mouth at bedtime.    Dispense:  30 tablet    Refill:  5    Order Specific Question:   Supervising Provider    Answer:   Quentin Angst [id number]  . diclofenac sodium (VOLTAREN) 1 % GEL    Sig: Apply 2 g topically 4 (four) times daily.    Dispense:  100 g    Refill:  5    Order Specific Question:   Supervising Provider    Answer:   Quentin Angst [id number]  . cetirizine (ZYRTEC) 10 MG tablet    Sig: Take 1 tablet (10 mg total) by mouth daily.    Dispense:  30 tablet    Refill:  11    Order Specific Question:   Supervising Provider    Answer:   Quentin Angst [id number]  . fluticasone (FLONASE) 50 MCG/ACT nasal spray    Sig: Place 2 sprays into both nostrils daily.    Dispense:  16 g    Refill:  6    Order Specific Question:   Supervising Provider    Answer:   Quentin Angst [[id number]]    Follow-up: Return in about 3 months (around 10/15/2017) for Bipolar Disorder.   Loretta Bark FNP

## 2017-07-15 NOTE — Progress Notes (Signed)
Patient is here for f/up  

## 2017-08-17 MED FILL — FLUTICASONE PROP 50 MCG SPR: 50 | 30 days supply | Qty: 16 | Fill #1

## 2017-08-17 MED FILL — ?CETIRIZINE HCL 10 MG TABLE: 10 | 30 days supply | Qty: 30 | Fill #1

## 2017-09-15 MED FILL — DICLOFENAC SODIUM 1% GEL: 1 | 12 days supply | Qty: 100 | Fill #1

## 2017-09-15 MED FILL — ?CETIRIZINE HCL 10 MG TABLE: 10 | 30 days supply | Qty: 30 | Fill #2

## 2017-10-13 MED FILL — FLUTICASONE PROP 50 MCG SPR: 50 | 30 days supply | Qty: 16 | Fill #2

## 2017-10-13 MED FILL — ?CETIRIZINE HCL 10 MG TABLE: 10 | 30 days supply | Qty: 30 | Fill #3

## 2017-10-15 ENCOUNTER — Encounter: Payer: Self-pay | Admitting: Family Medicine

## 2017-10-15 ENCOUNTER — Ambulatory Visit: Payer: Self-pay | Attending: Family Medicine | Admitting: Family Medicine

## 2017-10-15 VITALS — BP 109/71 | HR 69 | Temp 98.2°F | Resp 18 | Ht 64.0 in | Wt 148.0 lb

## 2017-10-15 DIAGNOSIS — Z1231 Encounter for screening mammogram for malignant neoplasm of breast: Secondary | ICD-10-CM

## 2017-10-15 DIAGNOSIS — F3176 Bipolar disorder, in full remission, most recent episode depressed: Secondary | ICD-10-CM

## 2017-10-15 DIAGNOSIS — Z79899 Other long term (current) drug therapy: Secondary | ICD-10-CM | POA: Insufficient documentation

## 2017-10-15 DIAGNOSIS — Z09 Encounter for follow-up examination after completed treatment for conditions other than malignant neoplasm: Secondary | ICD-10-CM

## 2017-10-15 DIAGNOSIS — Z1239 Encounter for other screening for malignant neoplasm of breast: Secondary | ICD-10-CM

## 2017-10-15 NOTE — Patient Instructions (Signed)
Living With Bipolar Disorder If you have been diagnosed with bipolar disorder, you may be relieved that you now know why you have felt or behaved a certain way. You may also feel overwhelmed about the treatment ahead, how to get the support you need, and how to deal with the condition day-to-day. With care and support, you can learn to manage your symptoms and live with bipolar disorder. How to manage lifestyle changes Managing stress Stress is your body's reaction to life changes and events, both good and bad. Stress can play a major role in bipolar disorder, so it is important to learn how to cope with stress. Some techniques to cope with stress include:  Meditation, muscle relaxation, and breathing exercises.  Exercise. Even a short daily walk can help to lower stress levels.  Getting enough good-quality sleep. Too little sleep can cause mania to start (can trigger mania).  Making a schedule to manage your time. Knowing your daily schedule can help to keep you from feeling overwhelmed by tasks and deadlines.  Spending time on hobbies that you enjoy.  Medicines Your health care provider may suggest certain medicines if he or she feels that they will help improve your condition. Avoid using caffeine, alcohol, and other substances that may prevent your medicines from working properly (may interact). It is also important to:  Talk with your pharmacist or health care provider about all the medicines that you take, their possible side effects, and which medicines are safe to take together.  Make it your goal to take part in all treatment decisions (shared decision-making). Ask about possible side effects of medicines that your health care provider recommends, and tell him or her how you feel about having those side effects. It is best if shared decision-making with your health care provider is part of your total treatment plan.  If you are taking medicines as part of your treatment, do not stop  taking medicines before you ask your health care provider if it is safe to stop. You may need to have the medicine slowly decreased (tapered) over time to decrease the risk of harmful side effects. Relationships Spend time with people that you trust and with whom you feel a sense of understanding and calm. Try to find friends or family members who make you feel safe and can help you control feelings of mania. Consider going to couples counseling, family education classes, or family therapy to:  Educate your loved ones about your condition and offer suggestions about how they can support you.  Help resolve conflicts.  Help develop communication skills in your relationships.  How to recognize changes in your condition Everyone responds differently to treatment for bipolar disorder. Some signs that your condition is improving include:  Leveling of your mood. You may have less anger and excitement about daily activities, and your low moods may not be as bad.  Your symptoms being less intense.  Feeling calm more often.  Thinking clearly.  Not experiencing consequences for extreme behavior.  Feeling like your life is settling down.  Your behavior seeming more normal to you and to other people.  Some signs that your condition may be getting worse include:  Sleep problems.  Moods cycling between deep lows and unusually high (excess) energy.  Extreme emotions.  More anger at loved ones.  Staying away from others (isolating yourself).  A feeling of power or superiority.  Completing a lot of tasks in a very short amount of time.  Unusual thoughts and behaviors.    Suicidal thoughts.  Where to find support Talking to others  Try making a list of the people you may want to tell about your condition, such as the people you trust most.  Plan what you are willing to talk about and what you do not want to discuss. Think about your needs ahead of time, and how your friends and family  members can support you.  Let your loved ones know when they can share advice and when you would just like them to listen.  Give your loved ones information about bipolar disorder, and encourage them to learn about the condition. Finances Not all insurance plans cover mental health care, so it is important to check with your insurance carrier. If paying for co-pays or counseling services is a problem, search for a local or county mental health care center. Public mental health care services may be offered there at a low cost or no cost when you are not able to see a private health care provider. If you are taking medicine for depression, you may be able to get the generic form, which may be less expensive than brand-name medicine. Some makers of prescription medicines also offer help to patients who cannot afford the medicines they need. Follow these instructions at home: Medicines  Take over-the-counter and prescription medicines only as told by your health care provider or pharmacist.  Ask your pharmacist what over-the-counter cold medicines you should avoid. Some medicines can make symptoms worse. General instructions  Ask for support from trusted family members or friends to make sure you stay on track with your treatment.  Keep a journal to write down your daily moods, medicines, sleep habits, and life events. This may help you have more success with your treatment.  Make and follow a routine for daily meal times. Eat healthy foods, such as whole grains, vegetables, and fresh fruit.  Try to go to sleep and wake up around the same time every day.  Keep all follow-up visits as told by your health care provider. This is important. Questions to ask your health care provider:  If you are taking medicines: ? How long do I need to take medicine? ? Are there any long-term side effects of my medicine? ? Are there any alternatives to taking medicine?  How would I benefit from  therapy?  How often should I follow up with a health care provider? Contact a health care provider if:  Your symptoms get worse or they do not get better with treatment. Get help right away if:  You have thoughts about harming yourself or others. If you ever feel like you may hurt yourself or others, or have thoughts about taking your own life, get help right away. You can go to your nearest emergency department or call:  Your local emergency services (911 in the [place]  A suicide crisis helpline, such as the National Suicide Prevention Lifeline at 1-800-273-8255. This is open 24-hours a day.  Summary  Learning ways to deal with stress can help to calm you and may also help your treatment work better.  There is a wide range of medicines that can help to treat bipolar disorder.  Having healthy relationships can help to make your moods more stable.  Contact a health care provider if your symptoms get worse or they do not get better with treatment. This information is not intended to replace advice given to you by your health care provider. Make sure you discuss any questions you have with your   health care provider. Document Released: 01/22/2017 Document Revised: 01/22/2017 Document Reviewed: 01/22/2017 Elsevier Interactive Patient Education  2018 [employer] Inc[employer]  

## 2017-10-15 NOTE — Progress Notes (Signed)
Subjective:  Patient ID: Loretta [name], female    DOB: 12/05/1960  Age: 57 y.o. MRN: [id number]  CC: Follow-up   HPI Loretta Blenderrie D Fabiano presents to re-establish care and for follow up. PMH  bipolar disorder, arthritis, and had hemorrhoidectomy. History of bipolar disorder. She denies any SI/HI. She reports adherence with Valium use reports receiving counseling resources in the past however she discontinued, she is not interested in restarting counseling services at this time. She declines speaking with LCSW at this time. Reports using Valium for over 1 year with symptoms of bipolar disorder and insomnia .History of arthritis of bilateral hands. She reports taken Voltaren gel for relief of symptoms.    Outpatient Medications Prior to Visit  Medication Sig Dispense Refill  . cetirizine (ZYRTEC) 10 MG tablet Take 1 tablet (10 mg total) by mouth daily. 30 tablet 11  . diazepam (VALIUM) 10 MG tablet Take 1 tablet (10 mg total) by mouth at bedtime. 30 tablet 5  . diclofenac sodium (VOLTAREN) 1 % GEL Apply 2 g topically 4 (four) times daily. 100 g 5  . fluticasone (FLONASE) 50 MCG/ACT nasal spray Place 2 sprays into both nostrils daily. 16 g 6  . Multiple Vitamins-Minerals (ALIVE WOMENS GUMMY) CHEW Chew 1 capsule by mouth 1 day or 1 dose.    . triamcinolone cream (KENALOG) 0.1 % Apply 1 application topically 2 (two) times daily. 30 g 0  . AMBULATORY NON FORMULARY MEDICATION Nitroglycerine ointment 0.125 %  Apply a pea sized amount internally four times daily. Dispense 30 GM zero refill (Patient not taking: Reported on 10/15/2017) 280 mL 1  . metroNIDAZOLE (METROGEL VAGINAL) 0.75 % vaginal gel Place 1 Applicatorful vaginally 2 (two) times daily. For 5 days (Patient not taking: Reported on 10/15/2017) 70 g 0   No facility-administered medications prior to visit.     ROS Review of Systems  Constitutional: Negative.   Respiratory: Negative.   Cardiovascular: Negative.   Gastrointestinal: Negative.    Musculoskeletal: Arthralgias: chronic.  Psychiatric/Behavioral: Negative for suicidal ideas.       History of bipolar disorder.    Objective:  BP 109/71 (BP Location: Left Arm, Patient Position: Sitting, Cuff Size: Normal)   Pulse 69   Temp 98.2 F (36.8 C) (Oral)   Resp 18   Ht 5\' 4"  (1.626 m)   Wt 148 lb (67.1 kg)   SpO2 97%   BMI 25.40 kg/m   BP/Weight 10/15/2017 07/15/2017 03/17/2017  Systolic BP 109 108 131  Diastolic BP 71 76 84  Wt. (Lbs) 148 141.4 146.6  BMI 25.4 23.53 24.78    Physical Exam  Constitutional: She appears well-developed and well-nourished.  Neck: No JVD present.  Cardiovascular: Normal rate, regular rhythm, normal heart sounds and intact distal pulses.  Pulmonary/Chest: Effort normal and breath sounds normal.  Abdominal: Soft. Bowel sounds are normal. There is no tenderness.  Musculoskeletal: She exhibits no tenderness.  Skin: Skin is warm and dry.  Psychiatric: She expresses no homicidal and no suicidal ideation. She expresses no suicidal plans and no homicidal plans. She is communicative. She is attentive.  Nursing note and vitals reviewed.   Assessment & Plan:   1. Bipolar disorder, in full remission, most recent episode depressed (HCC) She declines referral at this time. Given LCSW card and resource information.  2. Follow up   3. Breast cancer screening  - [facility] SCREENING BREAST TOMO BILATERAL; Future     Follow-up: Return in about 3 months (around 01/13/2018) for Bipolar  Disorder.   Lizbeth Bark FNP

## 2017-10-15 NOTE — Progress Notes (Signed)
Patient is here for follow-up. Patient need medication refills.

## 2017-10-23 ENCOUNTER — Ambulatory Visit: Payer: Self-pay | Attending: Family Medicine

## 2017-11-16 MED FILL — ?CETIRIZINE HCL 10 MG TABLE: 10 | 30 days supply | Qty: 30 | Fill #4

## 2017-11-16 MED FILL — DICLOFENAC SODIUM 1% GEL: 1 | 12 days supply | Qty: 100 | Fill #2

## 2017-12-02 ENCOUNTER — Other Ambulatory Visit: Payer: Self-pay | Admitting: Obstetrics and Gynecology

## 2017-12-02 DIAGNOSIS — Z1231 Encounter for screening mammogram for malignant neoplasm of breast: Secondary | ICD-10-CM

## 2017-12-14 MED FILL — FLUTICASONE PROP 50 MCG SPR: 50 | 30 days supply | Qty: 16 | Fill #3

## 2017-12-24 ENCOUNTER — Ambulatory Visit (HOSPITAL_COMMUNITY)
Admission: RE | Admit: 2017-12-24 | Discharge: 2017-12-24 | Disposition: A | Discharge status: Home / Self Care | Payer: Self-pay | Source: Ambulatory Visit | Attending: Obstetrics and Gynecology | Admitting: Obstetrics and Gynecology

## 2017-12-24 ENCOUNTER — Ambulatory Visit
Admission: RE | Admit: 2017-12-24 | Discharge: 2017-12-24 | Disposition: A | Discharge status: Home / Self Care | Payer: No Typology Code available for payment source | Source: Ambulatory Visit | Attending: Obstetrics and Gynecology | Admitting: Obstetrics and Gynecology

## 2017-12-24 ENCOUNTER — Encounter (HOSPITAL_COMMUNITY): Payer: Self-pay

## 2017-12-24 VITALS — BP 104/62 | Ht 65.0 in | Wt 150.0 lb

## 2017-12-24 DIAGNOSIS — Z1239 Encounter for other screening for malignant neoplasm of breast: Secondary | ICD-10-CM

## 2017-12-24 DIAGNOSIS — Z1231 Encounter for screening mammogram for malignant neoplasm of breast: Secondary | ICD-10-CM

## 2017-12-24 NOTE — Patient Instructions (Signed)
Explained breast self awareness with Loretta [name]. Patient did not need a Pap smear today due to last Pap smear and HPV typing was 05/29/2016. Let her know [facility] will cover Pap smears and HPV typing every 5 years unless has a history of abnormal Pap smears. [facility]. Appointment scheduled for Thursday, December 24, 2017 at 1510. Let patient know the Breast Center will follow up with her within the next couple weeks with results of mammogram by letter or phone. Loretta [name] verbalized understanding.  Kenric Ginger, Kathaleen Maserhristine Poll, RN 3:13 PM

## 2017-12-24 NOTE — Progress Notes (Signed)
No complaints today.   Pap Smear: Pap smear not completed today. [facility] and normal with negative HPV. Per patient has no history of an abnormal Pap smear. Last Pap smear result is in Epic.  Physical exam: Breasts Breasts symmetrical. No skin abnormalities bilateral breasts. No nipple retraction bilateral breasts. No nipple discharge bilateral breasts. No lymphadenopathy. No lumps palpated bilateral breasts. No complaints of pain or tenderness on exam. [facility] for a screening mammogram. Appointment scheduled for Thursday, December 24, 2017 at 1510.        Pelvic/Bimanual No Pap smear completed today since last Pap smear and HPV typing was 05/29/2016. Pap smear not indicated per [facility] guidelines.   Smoking History: Patient is a former smoker that quit in May 2009.  Patient Navigation: Patient education provided. Access to services provided for patient through [facility] program.   Colorectal Cancer Screening: Patient has a colonoscopy completed 09/12/2014. No complaints today. FIT Test given to patient to complete and return to [facility].  Breast and Cervical Cancer Risk Assessment: Patient has no family history of breast cancer, known genetic mutations, or radiation treatment to the chest before age 59. Patient has no history of cervical dysplasia, immunocompromised, or DES exposure in-utero.

## 2017-12-29 ENCOUNTER — Encounter (HOSPITAL_COMMUNITY): Payer: Self-pay | Admitting: *Deleted

## 2017-12-30 ENCOUNTER — Other Ambulatory Visit: Payer: Self-pay

## 2018-01-03 LAB — FECAL OCCULT BLOOD, IMMUNOCHEMICAL: FECAL OCCULT BLD: NEGATIVE

## 2018-01-04 ENCOUNTER — Encounter (HOSPITAL_COMMUNITY): Payer: Self-pay

## 2018-01-11 MED FILL — ?CETIRIZINE HCL 10 MG TABLE: 10 | 30 days supply | Qty: 30 | Fill #5

## 2018-01-11 MED FILL — DICLOFENAC SODIUM 1% GEL: 1 | 12 days supply | Qty: 100 | Fill #3

## 2018-01-13 ENCOUNTER — Encounter: Payer: Self-pay | Admitting: Nurse Practitioner

## 2018-01-13 ENCOUNTER — Ambulatory Visit: Payer: Self-pay | Attending: Nurse Practitioner | Admitting: Nurse Practitioner

## 2018-01-13 VITALS — BP 113/78 | HR 77 | Temp 98.2°F | Ht 64.0 in | Wt 149.4 lb

## 2018-01-13 DIAGNOSIS — Z885 Allergy status to narcotic agent status: Secondary | ICD-10-CM | POA: Insufficient documentation

## 2018-01-13 DIAGNOSIS — Z8249 Family history of ischemic heart disease and other diseases of the circulatory system: Secondary | ICD-10-CM | POA: Insufficient documentation

## 2018-01-13 DIAGNOSIS — Z801 Family history of malignant neoplasm of trachea, bronchus and lung: Secondary | ICD-10-CM | POA: Insufficient documentation

## 2018-01-13 DIAGNOSIS — Z880 Allergy status to penicillin: Secondary | ICD-10-CM | POA: Insufficient documentation

## 2018-01-13 DIAGNOSIS — G4700 Insomnia, unspecified: Secondary | ICD-10-CM | POA: Insufficient documentation

## 2018-01-13 DIAGNOSIS — Z7951 Long term (current) use of inhaled steroids: Secondary | ICD-10-CM | POA: Insufficient documentation

## 2018-01-13 DIAGNOSIS — Z79899 Other long term (current) drug therapy: Secondary | ICD-10-CM | POA: Insufficient documentation

## 2018-01-13 DIAGNOSIS — F3176 Bipolar disorder, in full remission, most recent episode depressed: Secondary | ICD-10-CM | POA: Insufficient documentation

## 2018-01-13 DIAGNOSIS — Z833 Family history of diabetes mellitus: Secondary | ICD-10-CM | POA: Insufficient documentation

## 2018-01-13 MED ORDER — QUETIAPINE FUMARATE 100 MG PO TABS: 100.0000 mg | ORAL_TABLET | Freq: Every day | ORAL | 0 refills | Status: DC

## 2018-01-13 MED FILL — QUETIAPINE FUMARATE 100 MG: 100 | 30 days supply | Qty: 30 | Fill #0

## 2018-01-13 NOTE — Patient Instructions (Signed)
Bipolar 1 Disorder Bipolar 1 disorder is a mental health disorder in which a person has episodes of emotional highs (mania), and may also have episodes of emotional lows (depression) in addition to highs. Bipolar 1 disorder is different from other bipolar disorders because it involves extreme manic episodes. These episodes last at least one week or involve symptoms that are so severe that hospitalization is needed to keep the person safe. What increases the risk? The cause of this condition is not known. However, certain factors make you more likely to have bipolar disorder, such as:  Having a family member with the disorder.  An imbalance of certain chemicals in the brain (neurotransmitters).  Stress, such as illness, financial problems, or a death.  Certain conditions that affect the brain or spinal cord (neurologic conditions).  Brain injury (trauma).  Having another mental health disorder, such as: ? Obsessive compulsive disorder. ? Schizophrenia.  What are the signs or symptoms? Symptoms of mania include:  Very high self-esteem or self-confidence.  Decreased need for sleep.  Unusual talkativeness or feeling a need to keep talking. Speech may be very fast. It may seem like you cannot stop talking.  Racing thoughts or constant talking, with quick shifts between topics that may or may not be related (flight of ideas).  Decreased ability to focus or concentrate.  Increased purposeful activity, such as work, studies, or social activity.  Increased nonproductive activity. This could be pacing, squirming and fidgeting, or finger and toe tapping.  Impulsive behavior and poor judgment. This may result in high-risk activities, such as having unprotected sex or spending a lot of money.  Symptoms of depression include:  Feeling sad, hopeless, or helpless.  Frequent or uncontrollable crying.  Lack of feeling or caring about anything.  Sleeping too much.  Moving more slowly  than usual.  Not being able to enjoy things you used to enjoy.  Wanting to be alone all the time.  Feeling guilty or worthless.  Lack of energy or motivation.  Trouble concentrating or remembering.  Trouble making decisions.  Increased appetite.  Thoughts of death, or the desire to harm yourself.  Sometimes, you may have a mixed mood. This means having symptoms of depression and mania. Stress can make symptoms worse. How is this diagnosed? To diagnose bipolar disorder, your health care provider may ask about your:  Emotional episodes.  Medical history.  Alcohol and drug use. This includes prescription medicines. Certain medical conditions and substances can cause symptoms that seem like bipolar disorder (secondary bipolar disorder).  How is this treated? Bipolar disorder is a long-term (chronic) illness. It is best controlled with ongoing (continuous) treatment rather than treatment only when symptoms occur. Treatment may include:  Medicine. Medicine can be prescribed by a provider who specializes in treating mental disorders (psychiatrist). ? Medicines called mood stabilizers are usually prescribed. ? If symptoms occur even while taking a mood stabilizer, other medicines may be added.  Psychotherapy. Some forms of talk therapy, such as cognitive-behavioral therapy (CBT), can provide support, education, and guidance.  Coping methods, such as journaling or relaxation exercises. These may include: ? Yoga. ? Meditation. ? Deep breathing.  Lifestyle changes, such as: ? Limiting alcohol and drug use. ? Exercising regularly. ? Getting plenty of sleep. ? Making healthy eating choices.  A combination of medicine, talk therapy, and coping methods is best. A procedure in which electricity is applied to the brain through the scalp (electroconvulsive therapy) may be used in cases of severe mania when medicine   and psychotherapy work too slowly or do not work. Follow these  instructions at home: Activity   Return to your normal activities as told by your health care provider.  Find activities that you enjoy, and make time to do them.  Exercise regularly as told by your health care provider. Lifestyle  Limit alcohol intake to no more than 1 drink a day for nonpregnant women and 2 drinks a day for men. One drink equals 12 oz of beer, 5 oz of wine, or 1 oz of hard liquor.  Follow a set schedule for eating and sleeping.  Eat a balanced diet that includes fresh fruits and vegetables, whole grains, low-fat dairy, and lean meat.  Get 7-8 hours of sleep each night. General instructions  Take over-the-counter and prescription medicines only as told by your health care provider.  Think about joining a support group. Your health care provider may be able to recommend a support group.  Talk with your family and loved ones about your treatment goals and how they can help.  Keep all follow-up visits as told by your health care provider. This is important. Where to find more information: For more information about bipolar disorder, visit the following websites:  [employer] Mental Illness: www.nami.org  [employer] Mental [employer]: http://www.maynard.net/  Contact a health care provider if:  Your symptoms get worse.  You have side effects from your medicine, and they get worse.  You have trouble sleeping.  You have trouble doing daily activities.  You feel unsafe in your surroundings.  You are dealing with substance abuse. Get help right away if:  You have new symptoms.  You have thoughts about harming yourself.  You self-harm. This information is not intended to replace advice given to you by your health care provider. Make sure you discuss any questions you have with your health care provider. Document Released: 12/29/2000 Document Revised: 05/18/2016 Document Reviewed: 05/22/2016 Elsevier Interactive Patient Education  2018  [employer].  Living With Bipolar Disorder If you have been diagnosed with bipolar disorder, you may be relieved that you now know why you have felt or behaved a certain way. You may also feel overwhelmed about the treatment ahead, how to get the support you need, and how to deal with the condition day-to-day. With care and support, you can learn to manage your symptoms and live with bipolar disorder. How to manage lifestyle changes Managing stress Stress is your body's reaction to life changes and events, both good and bad. Stress can play a major role in bipolar disorder, so it is important to learn how to cope with stress. Some techniques to cope with stress include:  Meditation, muscle relaxation, and breathing exercises.  Exercise. Even a short daily walk can help to lower stress levels.  Getting enough good-quality sleep. Too little sleep can cause mania to start (can trigger mania).  Making a schedule to manage your time. Knowing your daily schedule can help to keep you from feeling overwhelmed by tasks and deadlines.  Spending time on hobbies that you enjoy.  Medicines Your health care provider may suggest certain medicines if he or she feels that they will help improve your condition. Avoid using caffeine, alcohol, and other substances that may prevent your medicines from working properly (may interact). It is also important to:  Talk with your pharmacist or health care provider about all the medicines that you take, their possible side effects, and which medicines are safe to take together.  Make it your goal to take part in all treatment decisions (shared decision-making). Ask about possible side effects of medicines that your health care provider recommends, and tell him or her how you feel about having those side effects. It is best if shared decision-making with your health care provider is part of your total treatment plan.  If you are taking medicines as part of your  treatment, do not stop taking medicines before you ask your health care provider if it is safe to stop. You may need to have the medicine slowly decreased (tapered) over time to decrease the risk of harmful side effects. Relationships Spend time with people that you trust and with whom you feel a sense of understanding and calm. Try to find friends or family members who make you feel safe and can help you control feelings of mania. Consider going to couples counseling, family education classes, or family therapy to:  Educate your loved ones about your condition and offer suggestions about how they can support you.  Help resolve conflicts.  Help develop communication skills in your relationships.  How to recognize changes in your condition Everyone responds differently to treatment for bipolar disorder. Some signs that your condition is improving include:  Leveling of your mood. You may have less anger and excitement about daily activities, and your low moods may not be as bad.  Your symptoms being less intense.  Feeling calm more often.  Thinking clearly.  Not experiencing consequences for extreme behavior.  Feeling like your life is settling down.  Your behavior seeming more normal to you and to other people.  Some signs that your condition may be getting worse include:  Sleep problems.  Moods cycling between deep lows and unusually high (excess) energy.  Extreme emotions.  More anger at loved ones.  Staying away from others (isolating yourself).  A feeling of power or superiority.  Completing a lot of tasks in a very short amount of time.  Unusual thoughts and behaviors.  Suicidal thoughts.  Where to find support Talking to others  Try making a list of the people you may want to tell about your condition, such as the people you trust most.  Plan what you are willing to talk about and what you do not want to discuss. Think about your needs ahead of time, and how  your friends and family members can support you.  Let your loved ones know when they can share advice and when you would just like them to listen.  Give your loved ones information about bipolar disorder, and encourage them to learn about the condition. Finances Not all insurance plans cover mental health care, so it is important to check with your insurance carrier. If paying for co-pays or counseling services is a problem, search for a local or county mental health care center. Public mental health care services may be offered there at a low cost or no cost when you are not able to see a private health care provider. If you are taking medicine for depression, you may be able to get the generic form, which may be less expensive than brand-name medicine. Some makers of prescription medicines also offer help to patients who cannot afford the medicines they need. Follow these instructions at home: Medicines  Take over-the-counter and prescription medicines only as told by your health care provider or pharmacist.  Ask your pharmacist what over-the-counter cold medicines you should avoid. Some medicines can make symptoms worse. General instructions  Ask for  support from trusted family members or friends to make sure you stay on track with your treatment.  Keep a journal to write down your daily moods, medicines, sleep habits, and life events. This may help you have more success with your treatment.  Make and follow a routine for daily meal times. Eat healthy foods, such as whole grains, vegetables, and fresh fruit.  Try to go to sleep and wake up around the same time every day.  Keep all follow-up visits as told by your health care provider. This is important. Questions to ask your health care provider:  If you are taking medicines: ? How long do I need to take medicine? ? Are there any long-term side effects of my medicine? ? Are there any alternatives to taking medicine?  How would I  benefit from therapy?  How often should I follow up with a health care provider? Contact a health care provider if:  Your symptoms get worse or they do not get better with treatment. Get help right away if:  You have thoughts about harming yourself or others. If you ever feel like you may hurt yourself or others, or have thoughts about taking your own life, get help right away. You can go to your nearest emergency department or call:  Your local emergency services (911 in the [place]  A suicide crisis helpline, such as the National Suicide Prevention Lifeline at 205-656-4336. This is open 24-hours a day.  Summary  Learning ways to deal with stress can help to calm you and may also help your treatment work better.  There is a wide range of medicines that can help to treat bipolar disorder.  Having healthy relationships can help to make your moods more stable.  Contact a health care provider if your symptoms get worse or they do not get better with treatment. This information is not intended to replace advice given to you by your health care provider. Make sure you discuss any questions you have with your health care provider. Document Released: 01/22/2017 Document Revised: 01/22/2017 Document Reviewed: 01/22/2017 Elsevier Interactive Patient Education  2018 [employer].  Living With Bipolar Disorder If you have been diagnosed with bipolar disorder, you may be relieved that you now know why you have felt or behaved a certain way. You may also feel overwhelmed about the treatment ahead, how to get the support you need, and how to deal with the condition day-to-day. With care and support, you can learn to manage your symptoms and live with bipolar disorder. How to manage lifestyle changes Managing stress Stress is your body's reaction to life changes and events, both good and bad. Stress can play a major role in bipolar disorder, so it is important to learn how to cope with stress.  Some techniques to cope with stress include:  Meditation, muscle relaxation, and breathing exercises.  Exercise. Even a short daily walk can help to lower stress levels.  Getting enough good-quality sleep. Too little sleep can cause mania to start (can trigger mania).  Making a schedule to manage your time. Knowing your daily schedule can help to keep you from feeling overwhelmed by tasks and deadlines.  Spending time on hobbies that you enjoy.  Medicines Your health care provider may suggest certain medicines if he or she feels that they will help improve your condition. Avoid using caffeine, alcohol, and other substances that may prevent your medicines from working properly (may interact). It is also important to:  Talk with your pharmacist or  health care provider about all the medicines that you take, their possible side effects, and which medicines are safe to take together.  Make it your goal to take part in all treatment decisions (shared decision-making). Ask about possible side effects of medicines that your health care provider recommends, and tell him or her how you feel about having those side effects. It is best if shared decision-making with your health care provider is part of your total treatment plan.  If you are taking medicines as part of your treatment, do not stop taking medicines before you ask your health care provider if it is safe to stop. You may need to have the medicine slowly decreased (tapered) over time to decrease the risk of harmful side effects. Relationships Spend time with people that you trust and with whom you feel a sense of understanding and calm. Try to find friends or family members who make you feel safe and can help you control feelings of mania. Consider going to couples counseling, family education classes, or family therapy to:  Educate your loved ones about your condition and offer suggestions about how they can support you.  Help resolve  conflicts.  Help develop communication skills in your relationships.  How to recognize changes in your condition Everyone responds differently to treatment for bipolar disorder. Some signs that your condition is improving include:  Leveling of your mood. You may have less anger and excitement about daily activities, and your low moods may not be as bad.  Your symptoms being less intense.  Feeling calm more often.  Thinking clearly.  Not experiencing consequences for extreme behavior.  Feeling like your life is settling down.  Your behavior seeming more normal to you and to other people.  Some signs that your condition may be getting worse include:  Sleep problems.  Moods cycling between deep lows and unusually high (excess) energy.  Extreme emotions.  More anger at loved ones.  Staying away from others (isolating yourself).  A feeling of power or superiority.  Completing a lot of tasks in a very short amount of time.  Unusual thoughts and behaviors.  Suicidal thoughts.  Where to find support Talking to others  Try making a list of the people you may want to tell about your condition, such as the people you trust most.  Plan what you are willing to talk about and what you do not want to discuss. Think about your needs ahead of time, and how your friends and family members can support you.  Let your loved ones know when they can share advice and when you would just like them to listen.  Give your loved ones information about bipolar disorder, and encourage them to learn about the condition. Finances Not all insurance plans cover mental health care, so it is important to check with your insurance carrier. If paying for co-pays or counseling services is a problem, search for a local or county mental health care center. Public mental health care services may be offered there at a low cost or no cost when you are not able to see a private health care provider. If you  are taking medicine for depression, you may be able to get the generic form, which may be less expensive than brand-name medicine. Some makers of prescription medicines also offer help to patients who cannot afford the medicines they need. Follow these instructions at home: Medicines  Take over-the-counter and prescription medicines only as told by your health care provider or  pharmacist.  Ask your pharmacist what over-the-counter cold medicines you should avoid. Some medicines can make symptoms worse. General instructions  Ask for support from trusted family members or friends to make sure you stay on track with your treatment.  Keep a journal to write down your daily moods, medicines, sleep habits, and life events. This may help you have more success with your treatment.  Make and follow a routine for daily meal times. Eat healthy foods, such as whole grains, vegetables, and fresh fruit.  Try to go to sleep and wake up around the same time every day.  Keep all follow-up visits as told by your health care provider. This is important. Questions to ask your health care provider:  If you are taking medicines: ? How long do I need to take medicine? ? Are there any long-term side effects of my medicine? ? Are there any alternatives to taking medicine?  How would I benefit from therapy?  How often should I follow up with a health care provider? Contact a health care provider if:  Your symptoms get worse or they do not get better with treatment. Get help right away if:  You have thoughts about harming yourself or others. If you ever feel like you may hurt yourself or others, or have thoughts about taking your own life, get help right away. You can go to your nearest emergency department or call:  Your local emergency services (911 in the [place]  A suicide crisis helpline, such as the National Suicide Prevention Lifeline at 7655017851. This is open 24-hours a  day.  Summary  Learning ways to deal with stress can help to calm you and may also help your treatment work better.  There is a wide range of medicines that can help to treat bipolar disorder.  Having healthy relationships can help to make your moods more stable.  Contact a health care provider if your symptoms get worse or they do not get better with treatment. This information is not intended to replace advice given to you by your health care provider. Make sure you discuss any questions you have with your health care provider. Document Released: 01/22/2017 Document Revised: 01/22/2017 Document Reviewed: 01/22/2017 Elsevier Interactive Patient Education  [employer].

## 2018-01-13 NOTE — Progress Notes (Signed)
Assessment & Plan:  Loretta [name] was seen today for establish care.  Diagnoses and all orders for this visit:  Bipolar disorder, in full remission, most recent episode depressed (HCC) -     QUEtiapine (SEROQUEL) 100 MG tablet; Take 1 tablet (100 mg total) by mouth at bedtime.  Insomnia, unspecified type -     QUEtiapine (SEROQUEL) 100 MG tablet; Take 1 tablet (100 mg total) by mouth at bedtime.    Patient has been counseled on age-appropriate routine health concerns for screening and prevention. These are reviewed and up-to-date. Referrals have been placed accordingly. Immunizations are up-to-date or declined.    Subjective:   Chief Complaint  Patient presents with  . Establish Care    Pt. is here to establish care for her follow-up on her bipolar disorder.    HPI Loretta [name] 57 y.o. female presents to office today to establish care.    BIPOLAR DISORDER She has a history of bipolar disorder and insomnia and is currently taking Valium 10 mg  every night.  She does not see a behavioral health specialist for her bipolar disorder.  I instructed her today that I will not be able to refill her Valium based on my personal preference of not prescribing benzodiazepines and I offered other alternatives.  She became very angry and stated that she would just get her Valium from somebody else that she knows will order whatever she needs.  I suggested she speak to the on-site social worker today regarding options for OP behavioral health clinics in the area and she declined.  She endorses transportation issues and states that she cannot "go back and forth all the time" to see multiple providers..  She is very angry and loud. Yelling that she has tried Ambien, trazodone, melatonin, and "everything there is to try" for her insomnia and nothing has worked. I suggested she wean herself off of her current dose of Valium and we could try seroquel if she wants to continue to see me as her provider. She is  agreeable. I instructed her that she should not continue to take vallum and seroquel long term and needs to wean herself off as I will not refill her valium. She states she already has refills of valium and she does not need for me to refill it for her anyway. I did suggest to her again that she follow up with one of the local OP behavioral health centers for her bipolar disorder. She declines.   Review of Systems  Constitutional: Negative for fever, malaise/fatigue and weight loss.  HENT: Negative.  Negative for nosebleeds.   Eyes: Negative.  Negative for blurred vision, double vision and photophobia.  Respiratory: Negative.  Negative for cough and shortness of breath.   Cardiovascular: Negative.  Negative for chest pain, palpitations and leg swelling.  Gastrointestinal: Negative.  Negative for heartburn, nausea and vomiting.  Musculoskeletal: Negative.  Negative for myalgias.  Neurological: Negative.  Negative for dizziness, focal weakness, seizures and headaches.  Psychiatric/Behavioral: Negative for suicidal ideas. The patient has insomnia.        BIPOLAR DISORDER    Past Medical History:  Diagnosis Date  . Bipolar affective (HCC) 1991  . Diverticulosis of colon without diverticulitis 09/2014  . Insomnia     Past Surgical History:  Procedure Laterality Date  . APPENDECTOMY    . HEMORROIDECTOMY  01/21/2017  . TUBAL LIGATION      Family History  Problem Relation Age of Onset  . Lung cancer Father   .  Diabetes Father   . Cancer Father   . Heart attack Father   . Heart attack Mother   . Cancer Paternal Uncle   . Cancer Paternal Uncle   . Colon cancer Neg Hx   . Colon polyps Neg Hx   . Kidney disease Neg Hx   . Breast cancer Neg Hx     Social History Reviewed with no changes to be made today.   Outpatient Medications Prior to Visit  Medication Sig Dispense Refill  . cetirizine (ZYRTEC) 10 MG tablet Take 1 tablet (10 mg total) by mouth daily. 30 tablet 11  . diclofenac  sodium (VOLTAREN) 1 % GEL Apply 2 g topically 4 (four) times daily. 100 g 5  . fluticasone (FLONASE) 50 MCG/ACT nasal spray Place 2 sprays into both nostrils daily. 16 g 6  . Multiple Vitamins-Minerals (ALIVE WOMENS GUMMY) CHEW Chew 1 capsule by mouth 1 day or 1 dose.    . diazepam (VALIUM) 10 MG tablet Take 1 tablet (10 mg total) by mouth at bedtime. 30 tablet 5  . AMBULATORY NON FORMULARY MEDICATION Nitroglycerine ointment 0.125 %  Apply a pea sized amount internally four times daily. Dispense 30 GM zero refill (Patient not taking: Reported on 10/15/2017) 280 mL 1  . metroNIDAZOLE (METROGEL VAGINAL) 0.75 % vaginal gel Place 1 Applicatorful vaginally 2 (two) times daily. For 5 days (Patient not taking: Reported on 10/15/2017) 70 g 0  . triamcinolone cream (KENALOG) 0.1 % Apply 1 application topically 2 (two) times daily. (Patient not taking: Reported on 12/24/2017) 30 g 0   No facility-administered medications prior to visit.     Allergies  Allergen Reactions  . Codeine Itching, Nausea Only and Other (See Comments)    Pt feels like skin is crawling  . Penicillins Itching, Nausea Only and Other (See Comments)    Pt feels like skin is crawling       Objective:    BP 113/78 (BP Location: Left Arm, Patient Position: Sitting, Cuff Size: Normal)   Pulse 77   Temp 98.2 F (36.8 C) (Oral)   Ht 5\' 4"  (1.626 m)   Wt 149 lb 6.4 oz (67.8 kg)   SpO2 96%   BMI 25.64 kg/m  Wt Readings from Last 3 Encounters:  01/13/18 149 lb 6.4 oz (67.8 kg)  12/24/17 150 lb (68 kg)  10/15/17 148 lb (67.1 kg)    Physical Exam  Constitutional: She is oriented to person, place, and time. She appears well-developed and well-nourished. She is cooperative.  HENT:  Head: Normocephalic and atraumatic.  Eyes: EOM are normal.  Neck: Normal range of motion.  Cardiovascular: Normal rate, regular rhythm and normal heart sounds. Exam reveals no gallop and no friction rub.  No murmur heard. Pulmonary/Chest: Effort  normal and breath sounds normal. No tachypnea. No respiratory distress. She has no decreased breath sounds. She has no wheezes. She has no rhonchi. She has no rales. She exhibits no tenderness.  Abdominal: Soft. Bowel sounds are normal.  Musculoskeletal: Normal range of motion. She exhibits no edema.  Neurological: She is alert and oriented to person, place, and time. Coordination normal.  Skin: Skin is warm and dry.  Psychiatric: Her speech is normal and behavior is normal. Judgment and thought content normal. Her affect is angry, labile and inappropriate. Cognition and memory are normal.  Nursing note and vitals reviewed.      Patient has been counseled extensively about nutrition and exercise as well as the importance of adherence with  medications and regular follow-up. The patient was given clear instructions to go to ER or return to medical center if symptoms don't improve, worsen or new problems develop. The patient verbalized understanding.   Follow-up: Return in about 1 month (around 02/10/2018) for f/u insomnia.   Claiborne Rigg, FNP-BC [facility] [place], [place] [phone]   01/13/2018, 12:51 PM

## 2018-01-18 ENCOUNTER — Telehealth: Payer: Self-pay | Admitting: Nurse Practitioner

## 2018-01-18 ENCOUNTER — Other Ambulatory Visit: Payer: Self-pay | Admitting: Nurse Practitioner

## 2018-01-18 ENCOUNTER — Other Ambulatory Visit: Payer: Self-pay

## 2018-01-18 MED ORDER — DIAZEPAM 10 MG PO TABS: 10.0000 mg | ORAL_TABLET | Freq: Every day | ORAL | 1 refills | Status: DC

## 2018-01-18 NOTE — Telephone Encounter (Signed)
Called and left voice mail. Requested patient call the office back and let us know in more specific detail what the issue is and what needs to be filled or not filled.

## 2018-01-18 NOTE — Telephone Encounter (Signed)
Pt called very upset, she want to speak with the NP since she did not pick her med on 01/13/18 since the pharmacy was taking to long to get her prescription an also she want to know why she cancel her other prescription that she [facility], she want to be call today no later by noon or she will contact her Lawyer, please follow up

## 2018-01-18 NOTE — Telephone Encounter (Signed)
Patient was called and informed of prescription refill. Patient asked if it could be called into [phone], [facility], [address], [place], [place] [id number]. Patient will be able to pick it up herself and [facility] not [facility]

## 2018-01-18 NOTE — Telephone Encounter (Signed)
Patient called and stated that she wants her Diazepam to be refilled as it has been, she is upset that her prescription was cancelled(without her permission) and she wants her 10mg  [facility] diazep[facility] by the end of today so her friend can pick it up due to her having no way to get here.

## 2018-01-18 NOTE — Telephone Encounter (Signed)
RX sent

## 2018-01-18 NOTE — Telephone Encounter (Signed)
Refill will be sent to pharmacy. It was inadvertently discontinued after Seroquel was ordered. This order is from the order that was approved by her previous provider Loretta [name] please let the patient know this is from a previously approved prescription and she only has one additional refill after this and will no longer receive refills from this clinic.

## 2018-01-18 NOTE — Telephone Encounter (Signed)
Patient needs to call that [facility] and ask that they [facility] to prevent duplicate prescriptions and more refills than were intended.

## 2018-01-18 NOTE — Telephone Encounter (Signed)
Patient was informed and verbalized understanding.

## 2018-01-18 NOTE — Telephone Encounter (Signed)
[facility] [address]  Rx sent.

## 2018-02-15 MED FILL — DICLOFENAC SODIUM 1% GEL: 1 | 12 days supply | Qty: 100 | Fill #4

## 2018-02-15 MED FILL — FLUTICASONE PROP 50 MCG SPR: 50 | 30 days supply | Qty: 16 | Fill #4

## 2018-02-15 MED FILL — ?CETIRIZINE HCL 10 MG TABLE: 10 | 30 days supply | Qty: 30 | Fill #6

## 2018-03-16 ENCOUNTER — Ambulatory Visit: Payer: No Typology Code available for payment source | Attending: Nurse Practitioner | Admitting: Nurse Practitioner

## 2018-03-16 ENCOUNTER — Encounter: Payer: Self-pay | Admitting: Nurse Practitioner

## 2018-03-16 VITALS — BP 104/68 | HR 79 | Temp 98.3°F | Ht 64.0 in | Wt 149.0 lb

## 2018-03-16 DIAGNOSIS — F3176 Bipolar disorder, in full remission, most recent episode depressed: Secondary | ICD-10-CM

## 2018-03-16 DIAGNOSIS — R011 Cardiac murmur, unspecified: Secondary | ICD-10-CM | POA: Insufficient documentation

## 2018-03-16 DIAGNOSIS — Z79899 Other long term (current) drug therapy: Secondary | ICD-10-CM | POA: Insufficient documentation

## 2018-03-16 DIAGNOSIS — Z7951 Long term (current) use of inhaled steroids: Secondary | ICD-10-CM | POA: Insufficient documentation

## 2018-03-16 DIAGNOSIS — G4700 Insomnia, unspecified: Secondary | ICD-10-CM | POA: Insufficient documentation

## 2018-03-16 NOTE — Progress Notes (Signed)
Assessment & Plan:  Loretta [name] was seen today for follow-up.  Diagnoses and all orders for this visit:  Bipolar disorder, in full remission, most recent episode depressed [facility](HCC)    Patient has been counseled on age-appropriate routine health concerns for screening and prevention. These are reviewed and up-to-date. Referrals have been placed accordingly. Immunizations are up-to-date or declined.    Subjective:   Chief Complaint  Patient presents with  . Follow-up    Pt.is here for one month follow-up. Pt. stated the SeroQuel gave her a allergic reaction.    HPI Loretta [name] 57 y.o. female presents to office today for follow up to Seroquel. She is very irate, argumentative and yelling at me as soon as I enter the exam room. Stating I almost killed her by giving her Seroquel knowing she had an irregular heart rhythm (she has a history of heart murmur but not arrythmias). She stated she developed tongue and lip swelling, increased insomnia and "almost died" taking seroquel. I stated to her that there is always the chance of experiencing various degrees of side effects with the start of any new medication. She still has not weaned herself off valium. She continued to yell at me and demanded that I give her something to help her sleep.  She has already tried Ambien, trazodone, melatonin, and "everything there is to try" for her insomnia and nothing has worked. I previously had instructed her to follow up with OP behavioral health clinics in the community and when I reminded her of that today she yelled and stated "I'm not going anywhere and someone is going to give me something to help me!. I don't need to be treated for my bipolar disease I need medicine for sleep!". I instructed her that this provider patient relationship was not going to go any further and that she would need to see a new provider here in the office. At that point I personally walked her to the front desk and requested they schedule  her with a new provider. I do not feel comfortable prescribing her medication for insomnia when her bipolar disorder needs to be managed first and foremost.   Review of Systems  Constitutional: Negative for fever, malaise/fatigue and weight loss.  HENT: Negative.  Negative for nosebleeds.   Eyes: Negative.  Negative for blurred vision, double vision and photophobia.  Respiratory: Negative.  Negative for cough and shortness of breath.   Cardiovascular: Negative.  Negative for chest pain, palpitations and leg swelling.  Gastrointestinal: Negative.  Negative for heartburn, nausea and vomiting.  Musculoskeletal: Negative.  Negative for myalgias.  Neurological: Negative.  Negative for dizziness, focal weakness, seizures and headaches.  Psychiatric/Behavioral: Negative for suicidal ideas. The patient has insomnia.     Past Medical History:  Diagnosis Date  . Bipolar affective (HCC) 1991  . Diverticulosis of colon without diverticulitis 09/2014  . Insomnia     Past Surgical History:  Procedure Laterality Date  . APPENDECTOMY    . HEMORROIDECTOMY  01/21/2017  . TUBAL LIGATION      Family History  Problem Relation Age of Onset  . Lung cancer Father   . Diabetes Father   . Cancer Father   . Heart attack Father   . Heart attack Mother   . Cancer Paternal Uncle   . Cancer Paternal Uncle   . Colon cancer Neg Hx   . Colon polyps Neg Hx   . Kidney disease Neg Hx   . Breast cancer Neg Hx  Social History Reviewed with no changes to be made today.   Outpatient Medications Prior to Visit  Medication Sig Dispense Refill  . cetirizine (ZYRTEC) 10 MG tablet Take 1 tablet (10 mg total) by mouth daily. 30 tablet 11  . diazepam (VALIUM) 10 MG tablet Take 1 tablet (10 mg total) by mouth daily. 30 tablet 1  . diclofenac sodium (VOLTAREN) 1 % GEL Apply 2 g topically 4 (four) times daily. 100 g 5  . fluticasone (FLONASE) 50 MCG/ACT nasal spray Place 2 sprays into both nostrils daily. 16 g 6    . Multiple Vitamins-Minerals (ALIVE WOMENS GUMMY) CHEW Chew 1 capsule by mouth 1 day or 1 dose.    Marland Kitchen AMBULATORY NON FORMULARY MEDICATION Nitroglycerine ointment 0.125 %  Apply a pea sized amount internally four times daily. Dispense 30 GM zero refill (Patient not taking: Reported on 10/15/2017) 280 mL 1  . QUEtiapine (SEROQUEL) 100 MG tablet Take 1 tablet (100 mg total) by mouth at bedtime. (Patient not taking: Reported on 03/16/2018) 30 tablet 0   No facility-administered medications prior to visit.     Allergies  Allergen Reactions  . Codeine Itching, Nausea Only and Other (See Comments)    Pt feels like skin is crawling  . Penicillins Itching, Nausea Only and Other (See Comments)    Pt feels like skin is crawling       Objective:    BP 104/68 (BP Location: Left Arm, Patient Position: Sitting, Cuff Size: Normal)   Pulse 79   Temp 98.3 F (36.8 C) (Oral)   Ht 5\' 4"  (1.626 m)   Wt 149 lb (67.6 kg)   SpO2 100%   BMI 25.58 kg/m  Wt Readings from Last 3 Encounters:  03/16/18 149 lb (67.6 kg)  01/13/18 149 lb 6.4 oz (67.8 kg)  12/24/17 150 lb (68 kg)    Physical Exam       Patient has been counseled extensively about nutrition and exercise as well as the importance of adherence with medications and regular follow-up. The patient was given clear instructions to go to ER or return to medical center if symptoms don't improve, worsen or new problems develop. The patient verbalized understanding.   Follow-up: No follow-ups on file.   Claiborne Rigg, FNP-BC [facility] [place], [place] [phone]   03/16/2018, 9:34 AM

## 2018-03-22 MED FILL — ?CETIRIZINE HCL 10 MG TABLE: 10 | 30 days supply | Qty: 30 | Fill #7

## 2018-03-24 ENCOUNTER — Ambulatory Visit: Payer: Self-pay | Attending: Nurse Practitioner

## 2018-04-20 MED FILL — DICLOFENAC SODIUM 1% GEL: 1 | 12 days supply | Qty: 100 | Fill #5

## 2018-04-20 MED FILL — ?CETIRIZINE HCL 10 MG TABLE: 10 | 30 days supply | Qty: 30 | Fill #8

## 2018-04-20 MED FILL — FLUTICASONE PROP 50 MCG SPR: 50 | 30 days supply | Qty: 16 | Fill #5

## 2018-04-22 ENCOUNTER — Ambulatory Visit: Payer: Self-pay | Admitting: Licensed Clinical Social Worker

## 2018-04-22 ENCOUNTER — Ambulatory Visit: Payer: Self-pay | Attending: Internal Medicine | Admitting: Internal Medicine

## 2018-04-22 ENCOUNTER — Encounter: Payer: Self-pay | Admitting: Internal Medicine

## 2018-04-22 VITALS — BP 127/85 | HR 75 | Temp 98.7°F | Resp 16 | Ht 65.0 in | Wt 144.4 lb

## 2018-04-22 DIAGNOSIS — Z87891 Personal history of nicotine dependence: Secondary | ICD-10-CM | POA: Insufficient documentation

## 2018-04-22 DIAGNOSIS — F4321 Adjustment disorder with depressed mood: Secondary | ICD-10-CM

## 2018-04-22 DIAGNOSIS — F329 Major depressive disorder, single episode, unspecified: Secondary | ICD-10-CM

## 2018-04-22 DIAGNOSIS — Z885 Allergy status to narcotic agent status: Secondary | ICD-10-CM | POA: Insufficient documentation

## 2018-04-22 DIAGNOSIS — F419 Anxiety disorder, unspecified: Secondary | ICD-10-CM

## 2018-04-22 DIAGNOSIS — Z634 Disappearance and death of family member: Principal | ICD-10-CM

## 2018-04-22 DIAGNOSIS — F99 Mental disorder, not otherwise specified: Secondary | ICD-10-CM

## 2018-04-22 DIAGNOSIS — F319 Bipolar disorder, unspecified: Secondary | ICD-10-CM

## 2018-04-22 DIAGNOSIS — Z880 Allergy status to penicillin: Secondary | ICD-10-CM | POA: Insufficient documentation

## 2018-04-22 DIAGNOSIS — Z833 Family history of diabetes mellitus: Secondary | ICD-10-CM | POA: Insufficient documentation

## 2018-04-22 DIAGNOSIS — W010XXA Fall on same level from slipping, tripping and stumbling without subsequent striking against object, initial encounter: Secondary | ICD-10-CM | POA: Insufficient documentation

## 2018-04-22 DIAGNOSIS — Z8249 Family history of ischemic heart disease and other diseases of the circulatory system: Secondary | ICD-10-CM | POA: Insufficient documentation

## 2018-04-22 DIAGNOSIS — H00015 Hordeolum externum left lower eyelid: Secondary | ICD-10-CM

## 2018-04-22 DIAGNOSIS — Z801 Family history of malignant neoplasm of trachea, bronchus and lung: Secondary | ICD-10-CM | POA: Insufficient documentation

## 2018-04-22 DIAGNOSIS — M79645 Pain in left finger(s): Secondary | ICD-10-CM

## 2018-04-22 DIAGNOSIS — F5105 Insomnia due to other mental disorder: Secondary | ICD-10-CM

## 2018-04-22 DIAGNOSIS — F332 Major depressive disorder, recurrent severe without psychotic features: Secondary | ICD-10-CM

## 2018-04-22 DIAGNOSIS — Z7951 Long term (current) use of inhaled steroids: Secondary | ICD-10-CM | POA: Insufficient documentation

## 2018-04-22 DIAGNOSIS — G4700 Insomnia, unspecified: Secondary | ICD-10-CM | POA: Insufficient documentation

## 2018-04-22 MED ORDER — MIRTAZAPINE 7.5 MG PO TABS: 7.5000 mg | ORAL_TABLET | Freq: Every day | ORAL | 1 refills | Status: DC

## 2018-04-22 MED ORDER — HYDROXYZINE PAMOATE 50 MG PO CAPS: ORAL_CAPSULE | ORAL | 1 refills | Status: DC

## 2018-04-22 MED ORDER — HYDROXYZINE PAMOATE 25 MG PO CAPS: ORAL_CAPSULE | ORAL | 1 refills | Status: DC

## 2018-04-22 MED ORDER — HYDROXYZINE PAMOATE 25 MG PO CAPS
ORAL_CAPSULE | ORAL | 1 refills | Status: DC
Start: 2018-04-22 — End: 2018-04-22

## 2018-04-22 MED FILL — HYDROXYZINE PAM 50 MG CAP: 50 | 30 days supply | Qty: 30 | Fill #0

## 2018-04-22 MED FILL — MIRTAZAPINE 7.5 MG TABLET: 7.5 | 30 days supply | Qty: 30 | Fill #0

## 2018-04-22 NOTE — Progress Notes (Signed)
Patient ID: Loretta [name], female    DOB: 1960/12/30  MRN: [id number]  CC: re-establish and Medication Refill   Subjective: Loretta [name] is a 57 y.o. female who presents for chronic ds management and to establish with me as PCP. Her concerns today include:  Pt with hx of Bipolar disorder, arthritis  Patient presents today to establish with me as PCP.  She had seen NP Meredeth IdeFleming a few times and was not pleased with her because she discontinued her Valium which she feels she desperately needs for sleep.  She reports being on Valium for years.  Patient with history of bipolar disorder and is not under the care of a mental health provider and does not wish to be.  Had seen one in the distant past and did not like the medicines they tried her with as many made her feel she was not able to function.  Reports being tried with Zoloft in the past and did not tolerate it.  Did take Remeron and found that it helped her but "I still needed 20 mg of Valium to sleep at nights."  Trazodone gave her bad nightmares. -She has repeatedly refused to establish with mental health.  "I don't have a car and I don't have the patience to sit for hours."  -Recently placed on Seroquel and had an allergic reaction.  Reports that it made her throat swell and she had blisters around her mouth.  "I could not function.  I rather be awake and be a bitch to everybody else." -Normally gets in bed around 11 PM.  She turns off all lights and sounds.  No caffeine or alcoholic beverages at bedtime.  It takes about an hour for her to doze off.  She normally wakes up around 5 AM and gets started with her day.  However being off of Valium for the past 6 weeks she is up at 3 AM. -"When I do not get any sleep I get grumpy.  I am depressed and am not eating."  She lives with her boyfriend. -Today she is very tearful.  Her 526 year old daughter passed away about 3 weeks ago -She feels depressed.  Complains of having a stye on the left lower  eyelid for several weeks.  It was initially large and erythematous.  She shows me pictures on her phone.  She has been using warm compresses.  It is now much smaller but has not completely resolved as yet.  No vision changes.  Complains of pain at the base of the left thumb x2 days.  Attributes this to tripping and falling over her cat 2 days ago.  The pain is at the base of the thumb and shoots up the forearm.  She has some mild bruising but no swelling.    Patient Active Problem List   Diagnosis Date Noted  . Seasonal allergies 07/15/2017  . History of hemorrhoidectomy 07/15/2017  . Vaginal odor 03/17/2017  . 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 Fatigue due to depression 10/07/2016  . Bipolar disorder (HCC) 06/20/2016  . GERD (gastroesophageal reflux disease) 07/24/2014     Current Outpatient Medications on File Prior to Visit  Medication Sig Dispense Refill  . AMBULATORY NON FORMULARY MEDICATION Nitroglycerine ointment 0.125 %  Apply a pea sized amount internally four times daily. Dispense 30 GM zero refill (Patient not taking: Reported on 10/15/2017) 280 mL 1  . diclofenac sodium (VOLTAREN) 1 % GEL Apply 2 g topically 4 (four) times daily. 100 g 5  .  fluticasone (FLONASE) 50 MCG/ACT nasal spray Place 2 sprays into both nostrils daily. 16 g 6  . Multiple Vitamins-Minerals (ALIVE WOMENS GUMMY) CHEW Chew 1 capsule by mouth 1 day or 1 dose.     No current facility-administered medications on file prior to visit.     Allergies  Allergen Reactions  . Codeine Itching, Nausea Only and Other (See Comments)    Pt feels like skin is crawling  . Penicillins Itching, Nausea Only and Other (See Comments)    Pt feels like skin is crawling    Social History   Socioeconomic History  . Marital status: Divorced    Spouse name: Not on file  . Number of children: 5  . Years of education: Not on file  . Highest education level: Not on file  Occupational History  . Occupation: Media planner: [employer]  Social Needs  . Financial resource strain: Not on file  . Food insecurity:    Worry: Not on file    Inability: Not on file  . Transportation needs:    Medical: Not on file    Non-medical: Not on file  Tobacco Use  . Smoking status: Former Smoker    Types: Cigarettes    Last attempt to quit: 02/05/2008    Years since quitting: 10.2  . Smokeless tobacco: Never Used  . Tobacco comment: Use marijuana  Substance and Sexual Activity  . Alcohol use: Yes    Comment: Occassionally  . Drug use: Yes    Types: Marijuana    Comment: Every day for insomnia; relax to sleep  . Sexual activity: Yes    Birth control/protection: Surgical  Lifestyle  . Physical activity:    Days per week: 0 days    Minutes per session: 0 min  . Stress: Only a little  Relationships  . Social connections:    Talks on phone: More than three times a week    Gets together: More than three times a week    Attends religious service: Never    Active member of club or organization: No    Attends meetings of clubs or organizations: Never    Relationship status: Living with partner  . Intimate partner violence:    Fear of current or ex partner: No    Emotionally abused: No    Physically abused: No    Forced sexual activity: No  Other Topics Concern  . Not on file  Social History Narrative  . Not on file    Family History  Problem Relation Age of Onset  . Lung cancer Father   . Diabetes Father   . Cancer Father   . Heart attack Father   . Heart attack Mother   . Cancer Paternal Uncle   . Cancer Paternal Uncle   . Colon cancer Neg Hx   . Colon polyps Neg Hx   . Kidney disease Neg Hx   . Breast cancer Neg Hx     Past Surgical History:  Procedure Laterality Date  . APPENDECTOMY    . HEMORROIDECTOMY  01/21/2017  . TUBAL LIGATION      ROS: Review of Systems Negative except as stated above. PHYSICAL EXAM: BP 127/85   Pulse 75   Temp 98.7 F (37.1 C) (Oral)   Resp 16   Ht  5\' 5"  (1.651 m)   Wt 144 lb 6.4 oz (65.5 kg)   SpO2 96%   BMI 24.03 kg/m   Wt Readings from Last  3 Encounters:  04/22/18 144 lb 6.4 oz (65.5 kg)  03/16/18 149 lb (67.6 kg)  01/13/18 149 lb 6.4 oz (67.8 kg)    Physical Exam  General appearance -middle-aged Caucasian female in NAD. Mental status -patient tearful at times.  She has pressured speech. Eyes -small to lazy and noted on the midportion of the left lower lid.  No drainage noted. Musculoskeletal -left hand: Small area of mild resolving ecchymosis at the base of the left thumb.  Slight point tenderness in this area.  Movement of the [facility] joint is good.  No swelling noted.  Depression screen [facility] 2/9 04/22/2018 01/13/2018 10/15/2017  Decreased Interest 3 1 2   Down, Depressed, Hopeless 3 1 2   PHQ - 2 Score 6 2 4   Altered sleeping 3 3 2   Tired, decreased energy 3 2 2   Change in appetite 3 1 2   Feeling bad or failure about yourself  3 1 3   Trouble concentrating 3 1 1   Moving slowly or fidgety/restless 3 1 2   Suicidal thoughts 3 0 1  PHQ-9 Score 27 11 17   Some recent data might be hidden     ASSESSMENT AND PLAN: 1. Insomnia due to other mental disorder 2. Reactive depression -Since she has been off of Valium for about 6 weeks, I will hold off on putting her back on that.  Advised that it is not a good idea to maintain long-term benzodiazepine use for insomnia.  She currently has some depression and I do not think her bipolar is controlled.  Again I recommend seeing a mental health specialist.  But patient again declined for reasons mentioned above in the history. -We discussed good sleep hygiene. -She is agreeable to trying Remeron and low-dose of hydroxyzine.  Advised to stop Zyrtec. Patient seen by LCSW today. - mirtazapine (REMERON) 7.5 MG tablet; Take 1 tablet (7.5 mg total) by mouth at bedtime.  Dispense: 30 tablet; Refill: 1 - hydrOXYzine (VISTARIL) 50 MG capsule; 1 tab PO QHS PRN  Dispense: 30 capsule; Refill: 1  3.  Bipolar disease, chronic (HCC) See #1 above  4. Hordeolum externum of left lower eyelid Compared to the patches that she showed me, this is significantly improved.  Continue warm compresses.  If it does not completely resolve or recurs we can refer to ophthalmology.  5. Pain of left thumb I do not think that this is broken.  However I did offer x-ray of the base of the thumb.  Patient declines.  She can use Tylenol as needed.  Patient was given the opportunity to ask questions.  Patient verbalized understanding of the plan and was able to repeat key elements of the plan.   No orders of the defined types were placed in this encounter.    Requested Prescriptions   Signed Prescriptions Disp Refills  . mirtazapine (REMERON) 7.5 MG tablet 30 tablet 1    Sig: Take 1 tablet (7.5 mg total) by mouth at bedtime.  . hydrOXYzine (VISTARIL) 50 MG capsule 30 capsule 1    Sig: 1 tab PO QHS PRN    Return in about 7 weeks (around 06/10/2018).  Jonah Blue, MD, FACP

## 2018-04-22 NOTE — Progress Notes (Signed)
Pt states she thought she had a stye and it hasn't gone away  Pt states she has tried everything to get rid of it   Pt states she lost her daughter last month  Pt states the autopy saisd she dies on June 22 but they found her on the June 24  Pt states she has been crying for the past 3 weeks

## 2018-04-22 NOTE — Patient Instructions (Signed)
Stop the Zyrtec.  Start Remeron and Hydroxyzine at bedtime.    Use warm compresses to the eye as discussed.

## 2018-04-23 NOTE — BH Specialist Note (Signed)
Integrated Behavioral Health Follow Up Visit  MRN: [id number] Name: Loretta [name]  Number of Integrated Behavioral Health Clinician visits: 1/6 Session Start time: 8:45 AM  Session End time: 9:15 AM Total time: 30 minutes  Type of Service: Integrated Behavioral Health- Individual/Family Interpretor:No. Interpretor Name and Language: N/A  SUBJECTIVE: Loretta [name] is a 57 y.o. female accompanied by self Patient was referred by Dr. Laural BenesJohnson for depression and anxiety. Patient reports the following symptoms/concerns: overwhelming feelings of sadness and worry, insomnia, decreased appetite, low energy, irritability, decreased concentration, feeling bad about self, restlessness, and hx of suicidal ideations Duration of problem: Ongoing; Severity of problem: severe  OBJECTIVE: Mood: Anxious and Irritable and Affect: Labile and Tearful Risk of harm to self or others: No plan to harm self or others  LIFE CONTEXT: Family and Social: Pt receives emotional support from live-in partner and her best friend. She has adult children and grandchildren that resides nearby School/Work: Pt receives financial support from partner, who is employed.  Self-Care: Pt participates in medication management through PCP. She is not open to initiating psychotherapy Life Changes: Pt reports experiencing an increase in anxiety and depression due to the recent loss of pt's adult daughter and having insomnia due to being taking off a medication that pt used for many years  GOALS ADDRESSED: Patient will: 1.  Reduce symptoms of: agitation, anxiety, depression and insomnia  2.  Increase knowledge and/or ability of: coping skills and healthy habits  3.  Demonstrate ability to: Increase healthy adjustment to current life circumstances, Increase adequate support systems for patient/family, Increase motivation to adhere to plan of care and Begin healthy grieving over loss  INTERVENTIONS: Interventions utilized:   Motivational Interviewing, Supportive Counseling and Link to [facility] Standardized Assessments completed: GAD-7 and PHQ 2&9 with C-SSRS  ASSESSMENT: Patient currently experiencing depression and anxiety triggered by the recent loss of pt's adult daughter and having insomnia due to being taking off a medication that pt used for many years. She reports overwhelming feelings of sadness and worry, insomnia, decreased appetite, low energy, irritability, decreased concentration, feeling bad about self, restlessness, and hx of suicidal ideations. Denies any current SI/HI/AVH. Pt was successful in identifying protective factors and a safety plan was discussed. Pt was provided crisis intervention resources. She reports receiving strong support from family, friends, and partner.  Patient may benefit from psychoeducation, psychotherapy, and medication management. LCSWA educated pt on the stages of grief, provided validation, and offered encouragement. Pt was provided grief support resources and was strongly encouraged to initiate behavioral health services. Pt is not interested in participating in services. LCSWA discussed how her mental health can correlate to physical conditions (I.e. Insomnia) Strategies to improve sleep hygiene was discussed.   PLAN: 1. Follow up with behavioral health clinician on : Pt was encouraged to contact LCSWA if symptoms worsen or fail to improve to [facility]. 2. Behavioral recommendations: LCSWA recommends that pt apply healthy coping skills discussed, comply with medication management, initiate psychotherapy and/or grief support, and utilize provided resources. Pt is encouraged to schedule follow up appointment with LCSWA 3. Referral(s): [facility] and Grief Support  4. "From scale of 1-10, how likely are you to follow plan?":   Bridgett LarssonJasmine D Alexi Dorminey, LCSW 04/23/18 1:37 PM

## 2018-04-29 ENCOUNTER — Ambulatory Visit: Payer: Self-pay | Admitting: Internal Medicine

## 2018-05-18 MED FILL — HYDROXYZINE PAM 50 MG CAP: 50 | 30 days supply | Qty: 30 | Fill #1

## 2018-05-18 MED FILL — MIRTAZAPINE 7.5 MG TABLET: 7.5 | 30 days supply | Qty: 30 | Fill #1

## 2018-06-10 ENCOUNTER — Encounter: Payer: Self-pay | Admitting: Internal Medicine

## 2018-06-10 ENCOUNTER — Ambulatory Visit: Payer: Self-pay | Attending: Internal Medicine | Admitting: Internal Medicine

## 2018-06-10 VITALS — BP 125/76 | HR 83 | Temp 98.2°F | Resp 16 | Wt 147.2 lb

## 2018-06-10 DIAGNOSIS — Z8249 Family history of ischemic heart disease and other diseases of the circulatory system: Secondary | ICD-10-CM | POA: Insufficient documentation

## 2018-06-10 DIAGNOSIS — Z87891 Personal history of nicotine dependence: Secondary | ICD-10-CM | POA: Insufficient documentation

## 2018-06-10 DIAGNOSIS — Z9889 Other specified postprocedural states: Secondary | ICD-10-CM | POA: Insufficient documentation

## 2018-06-10 DIAGNOSIS — Z801 Family history of malignant neoplasm of trachea, bronchus and lung: Secondary | ICD-10-CM | POA: Insufficient documentation

## 2018-06-10 DIAGNOSIS — R0981 Nasal congestion: Secondary | ICD-10-CM | POA: Insufficient documentation

## 2018-06-10 DIAGNOSIS — J3489 Other specified disorders of nose and nasal sinuses: Secondary | ICD-10-CM

## 2018-06-10 DIAGNOSIS — Z79899 Other long term (current) drug therapy: Secondary | ICD-10-CM | POA: Insufficient documentation

## 2018-06-10 DIAGNOSIS — S60511A Abrasion of right hand, initial encounter: Secondary | ICD-10-CM | POA: Insufficient documentation

## 2018-06-10 DIAGNOSIS — F5105 Insomnia due to other mental disorder: Secondary | ICD-10-CM | POA: Insufficient documentation

## 2018-06-10 DIAGNOSIS — Z833 Family history of diabetes mellitus: Secondary | ICD-10-CM | POA: Insufficient documentation

## 2018-06-10 DIAGNOSIS — Z131 Encounter for screening for diabetes mellitus: Secondary | ICD-10-CM | POA: Insufficient documentation

## 2018-06-10 DIAGNOSIS — W010XXA Fall on same level from slipping, tripping and stumbling without subsequent striking against object, initial encounter: Secondary | ICD-10-CM | POA: Insufficient documentation

## 2018-06-10 DIAGNOSIS — F329 Major depressive disorder, single episode, unspecified: Secondary | ICD-10-CM

## 2018-06-10 DIAGNOSIS — S80219A Abrasion, unspecified knee, initial encounter: Secondary | ICD-10-CM | POA: Insufficient documentation

## 2018-06-10 DIAGNOSIS — K219 Gastro-esophageal reflux disease without esophagitis: Secondary | ICD-10-CM | POA: Insufficient documentation

## 2018-06-10 DIAGNOSIS — F319 Bipolar disorder, unspecified: Secondary | ICD-10-CM | POA: Insufficient documentation

## 2018-06-10 DIAGNOSIS — Z885 Allergy status to narcotic agent status: Secondary | ICD-10-CM | POA: Insufficient documentation

## 2018-06-10 DIAGNOSIS — Y9301 Activity, walking, marching and hiking: Secondary | ICD-10-CM | POA: Insufficient documentation

## 2018-06-10 DIAGNOSIS — M19049 Primary osteoarthritis, unspecified hand: Secondary | ICD-10-CM | POA: Insufficient documentation

## 2018-06-10 DIAGNOSIS — F99 Mental disorder, not otherwise specified: Secondary | ICD-10-CM

## 2018-06-10 DIAGNOSIS — Z880 Allergy status to penicillin: Secondary | ICD-10-CM | POA: Insufficient documentation

## 2018-06-10 MED ORDER — FLUTICASONE PROPIONATE 50 MCG/ACT NA SUSP: 2.0000 | Freq: Every day | NASAL | 6 refills | Status: DC

## 2018-06-10 MED ORDER — MIRTAZAPINE 7.5 MG PO TABS: 7.5000 mg | ORAL_TABLET | Freq: Every day | ORAL | 3 refills | Status: DC

## 2018-06-10 MED ORDER — DICLOFENAC SODIUM 1 % TD GEL: 2.0000 g | Freq: Four times a day (QID) | TRANSDERMAL | 5 refills | Status: DC

## 2018-06-10 MED ORDER — CETIRIZINE HCL 10 MG PO TABS
10.0000 mg | ORAL_TABLET | Freq: Every day | ORAL | 3 refills | Status: DC
Start: 2018-06-10 — End: 2018-06-10

## 2018-06-10 MED ORDER — CETIRIZINE HCL 10 MG PO TABS: 10.0000 mg | ORAL_TABLET | Freq: Every day | ORAL | 3 refills | Status: DC

## 2018-06-10 MED FILL — DICLOFENAC SODIUM 1% GEL: 1 | 12 days supply | Qty: 100 | Fill #0

## 2018-06-10 MED FILL — ?CETIRIZINE HCL 10 MG TABLE: 10 | 30 days supply | Qty: 30 | Fill #0

## 2018-06-10 MED FILL — FLUTICASONE PROP 50 MCG SPR: 50 | 30 days supply | Qty: 16 | Fill #0

## 2018-06-10 NOTE — Patient Instructions (Signed)
Stop Hydroxyzine

## 2018-06-10 NOTE — Progress Notes (Signed)
Patient ID: BATUL DIEGO, female    DOB: 1960-11-17  MRN: [id number]  CC: Follow-up (7 week )   Subjective: Loretta [name] is a 57 y.o. female who presents for 6 wks f/u Her concerns today include:  Pt with hx of Bipolar disorder, reactive depression, arthritis, insomnia  On last visit patient was started on Remeron and hydroxyzine to help with depression and insomnia.  She reports that she has to take both medications at 9: 30 p.m. if she takes them any later she feels hung over the next day.  She also reports dry mouth to the point where she chews the inside of her lips.  She was on Remeron many years ago and does not recall it causing dry mouth like this.  She has some abrasions on the palm of the right hand and both knees that she sustained yesterday when she tripped and fell while walking her dogs.  She has been using triple antibiotic ointment on the areas.      She is requesting refills on several medications including Flonase, Voltaren gel and is requesting to be put back on Zyrtec. Patient Active Problem List   Diagnosis Date Noted  . Seasonal allergies 07/15/2017  . History of hemorrhoidectomy 07/15/2017  . Vaginal odor 03/17/2017  . 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 Fatigue due to depression 10/07/2016  . Bipolar disorder (HCC) 06/20/2016  . GERD (gastroesophageal reflux disease) 07/24/2014     Current Outpatient Medications on File Prior to Visit  Medication Sig Dispense Refill  . AMBULATORY NON FORMULARY MEDICATION Nitroglycerine ointment 0.125 %  Apply a pea sized amount internally four times daily. Dispense 30 GM zero refill (Patient not taking: Reported on 10/15/2017) 280 mL 1  . Multiple Vitamins-Minerals (ALIVE WOMENS GUMMY) CHEW Chew 1 capsule by mouth 1 day or 1 dose.     No current facility-administered medications on file prior to visit.     Allergies  Allergen Reactions  . Codeine Itching, Nausea Only and Other (See Comments)    Pt feels like skin is  crawling  . Penicillins Itching, Nausea Only and Other (See Comments)    Pt feels like skin is crawling    Social History   Socioeconomic History  . Marital status: Divorced    Spouse name: Not on file  . Number of children: 5  . Years of education: Not on file  . Highest education level: Not on file  Occupational History  . 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  Social Needs  . Financial resource strain: Not on file  . Food insecurity:    Worry: Not on file    Inability: Not on file  . Transportation needs:    Medical: Not on file    Non-medical: Not on file  Tobacco Use  . Smoking status: Former Smoker    Types: Cigarettes    Last attempt to quit: 02/05/2008    Years since quitting: 10.3  . Smokeless tobacco: Never Used  . Tobacco comment: Use marijuana  Substance and Sexual Activity  . Alcohol use: Yes    Comment: Occassionally  . Drug use: Yes    Types: Marijuana    Comment: Every day for insomnia; relax to sleep  . Sexual activity: Yes    Birth control/protection: Surgical  Lifestyle  . Physical activity:    Days per week: 0 days    Minutes per session: 0 min  . Stress: Only a little  Relationships  .  Social connections:    Talks on phone: More than three times a week    Gets together: More than three times a week    Attends religious service: Never    Active member of club or organization: No    Attends meetings of clubs or organizations: Never    Relationship status: Living with partner  . Intimate partner violence:    Fear of current or ex partner: No    Emotionally abused: No    Physically abused: No    Forced sexual activity: No  Other Topics Concern  . Not on file  Social History Narrative  . Not on file    Family History  Problem Relation Age of Onset  . Lung cancer Father   . Diabetes Father   . Cancer Father   . Heart attack Father   . Heart attack Mother   . Cancer Paternal Uncle   . Cancer Paternal Uncle   . Colon  cancer Neg Hx   . Colon polyps Neg Hx   . Kidney disease Neg Hx   . Breast cancer Neg Hx     Past Surgical History:  Procedure Laterality Date  . APPENDECTOMY    . HEMORROIDECTOMY  01/21/2017  . TUBAL LIGATION      ROS: Review of Systems Negative except as stated above. PHYSICAL EXAM: BP 125/76   Pulse 83   Temp 98.2 F (36.8 C) (Oral)   Resp 16   Wt 147 lb 3.2 oz (66.8 kg)   SpO2 99%   BMI 24.50 kg/m   Wt Readings from Last 3 Encounters:  06/10/18 147 lb 3.2 oz (66.8 kg)  04/22/18 144 lb 6.4 oz (65.5 kg)  03/16/18 149 lb (67.6 kg)    Physical Exam  General appearance - alert, well appearing, and in no distress Mental status - normal mood, behavior, speech, dress, motor activity, and thought processes Chest - clear to auscultation, no wheezes, rales or rhonchi, symmetric air entry Heart - normal rate, regular rhythm, normal S1, S2, no murmurs, rubs, clicks or gallops Extremities - peripheral pulses normal, no pedal edema, no clubbing or cyanosis Skin: Small abrasions inferior to the kneecaps.  3 cm abrasion on the thenar eminence of the right hand.  No surrounding erythema or drainage.  ASSESSMENT AND PLAN: 1. Insomnia due to other mental disorder 2. Reactive depression -Patient informed that both Remeron and hydroxyzine can cause dry mouth.  Recommend that patient stop hydroxyzine.  She is wanting to restart Zyrtec for her allergies and nasal congestion.  She states that this worked better for her than hydroxyzine and did not have any dry mouth from it. - Lipid panel - Comprehensive metabolic panel - mirtazapine (REMERON) 7.5 MG tablet; Take 1 tablet (7.5 mg total) by mouth at bedtime.  Dispense: 30 tablet; Refill: 3  3. Hand arthritis - diclofenac sodium (VOLTAREN) 1 % GEL; Apply 2 g topically 4 (four) times daily.  Dispense: 100 g; Refill: 5  4. Nasal congestion with rhinorrhea - fluticasone (FLONASE) 50 MCG/ACT nasal spray; Place 2 sprays into both nostrils  daily.  Dispense: 16 g; Refill: 6 - cetirizine (ZYRTEC) 10 MG tablet; Take 1 tablet (10 mg total) by mouth daily.  Dispense: 30 tablet; Refill: 3  5. Abrasion of knee, unspecified laterality, initial encounter 6. Abrasion of right hand, initial encounter Patient can continue to use over-the-counter triple antibiotic ointment.  7. Diabetes mellitus screening - Hemoglobin A1c   Patient was given the opportunity to ask  questions.  Patient verbalized understanding of the plan and was able to repeat key elements of the plan.   Orders Placed This Encounter  Procedures  . Hemoglobin A1c  . Lipid panel  . Comprehensive metabolic panel     Requested Prescriptions   Signed Prescriptions Disp Refills  . mirtazapine (REMERON) 7.5 MG tablet 30 tablet 3    Sig: Take 1 tablet (7.5 mg total) by mouth at bedtime.  . fluticasone (FLONASE) 50 MCG/ACT nasal spray 16 g 6    Sig: Place 2 sprays into both nostrils daily.  . diclofenac sodium (VOLTAREN) 1 % GEL 100 g 5    Sig: Apply 2 g topically 4 (four) times daily.  . cetirizine (ZYRTEC) 10 MG tablet 30 tablet 3    Sig: Take 1 tablet (10 mg total) by mouth daily.    Return in about 4 months (around 10/10/2018).  Jonah Blue, MD, FACP

## 2018-06-11 LAB — COMPREHENSIVE METABOLIC PANEL
ALBUMIN: 4.8 g/dL (ref 3.5–5.5)
ALT: 23 IU/L (ref 0–32)
AST: 24 IU/L (ref 0–40)
Albumin/Globulin Ratio: 1.8 (ref 1.2–2.2)
Alkaline Phosphatase: 53 IU/L (ref 39–117)
BUN / CREAT RATIO: 31 — AB (ref 9–23)
BUN: 29 mg/dL — AB (ref 6–24)
Bilirubin Total: 0.2 mg/dL (ref 0.0–1.2)
CO2: 24 mmol/L (ref 20–29)
CREATININE: 0.94 mg/dL (ref 0.57–1.00)
Calcium: 9.9 mg/dL (ref 8.7–10.2)
Chloride: 100 mmol/L (ref 96–106)
GFR calc non Af Amer: 67 mL/min/{1.73_m2} (ref >59)
GFR, EST AFRICAN AMERICAN: 77 mL/min/{1.73_m2} (ref >59)
GLUCOSE: 55 mg/dL — AB (ref 65–99)
Globulin, Total: 2.6 g/dL (ref 1.5–4.5)
Potassium: 4.7 mmol/L (ref 3.5–5.2)
Sodium: 142 mmol/L (ref 134–144)
TOTAL PROTEIN: 7.4 g/dL (ref 6.0–8.5)

## 2018-06-11 LAB — LIPID PANEL
Chol/HDL Ratio: 2.5 ratio (ref 0.0–4.4)
Cholesterol, Total: 213 mg/dL — ABNORMAL HIGH (ref 100–199)
HDL: 85 mg/dL (ref >39)
LDL Calculated: 105 mg/dL — ABNORMAL HIGH (ref 0–99)
Triglycerides: 114 mg/dL (ref 0–149)
VLDL Cholesterol Cal: 23 mg/dL (ref 5–40)

## 2018-06-11 LAB — HEMOGLOBIN A1C
ESTIMATED AVERAGE GLUCOSE: 111 mg/dL
HEMOGLOBIN A1C: 5.5 % (ref 4.8–5.6)

## 2018-06-16 ENCOUNTER — Telehealth: Payer: Self-pay

## 2018-06-16 NOTE — Telephone Encounter (Signed)
Contacted pt to go over lab results pt is aware of results and doesn't have any questions or concerns 

## 2018-06-21 MED FILL — MIRTAZAPINE 7.5 MG TABLET: 7.5 | 30 days supply | Qty: 30 | Fill #0

## 2018-07-19 MED FILL — MIRTAZAPINE 7.5 MG TABLET: 7.5 | 30 days supply | Qty: 30 | Fill #1

## 2018-07-19 MED FILL — ?CETIRIZINE HCL 10 MG TABLE: 10 | 30 days supply | Qty: 30 | Fill #1

## 2018-08-16 MED FILL — DICLOFENAC SODIUM 1% GEL: 1 | 12 days supply | Qty: 100 | Fill #1

## 2018-08-16 MED FILL — FLUTICASONE PROP 50 MCG SPR: 50 | 30 days supply | Qty: 16 | Fill #1

## 2018-08-16 MED FILL — ?CETIRIZINE HCL 10 MG TABLE: 10 | 30 days supply | Qty: 30 | Fill #2

## 2018-08-23 MED FILL — MIRTAZAPINE 7.5 MG TABLET: 7.5 | 30 days supply | Qty: 30 | Fill #2

## 2018-09-20 MED FILL — MIRTAZAPINE 7.5 MG TABLET: 7.5 | 30 days supply | Qty: 30 | Fill #3

## 2018-09-20 MED FILL — ?CETIRIZINE HCL 10 MG TABLE: 10 | 30 days supply | Qty: 30 | Fill #3

## 2018-10-11 ENCOUNTER — Ambulatory Visit: Payer: Self-pay | Attending: Internal Medicine | Admitting: Internal Medicine

## 2018-10-11 ENCOUNTER — Ambulatory Visit: Payer: Self-pay | Attending: Internal Medicine | Admitting: Licensed Clinical Social Worker

## 2018-10-11 ENCOUNTER — Encounter: Payer: Self-pay | Admitting: Internal Medicine

## 2018-10-11 VITALS — BP 117/77 | HR 65 | Temp 98.1°F | Resp 16 | Wt 145.6 lb

## 2018-10-11 DIAGNOSIS — Z79899 Other long term (current) drug therapy: Secondary | ICD-10-CM | POA: Insufficient documentation

## 2018-10-11 DIAGNOSIS — L84 Corns and callosities: Secondary | ICD-10-CM | POA: Insufficient documentation

## 2018-10-11 DIAGNOSIS — F5105 Insomnia due to other mental disorder: Secondary | ICD-10-CM | POA: Insufficient documentation

## 2018-10-11 DIAGNOSIS — K219 Gastro-esophageal reflux disease without esophagitis: Secondary | ICD-10-CM | POA: Insufficient documentation

## 2018-10-11 DIAGNOSIS — L728 Other follicular cysts of the skin and subcutaneous tissue: Secondary | ICD-10-CM | POA: Insufficient documentation

## 2018-10-11 DIAGNOSIS — F99 Mental disorder, not otherwise specified: Secondary | ICD-10-CM

## 2018-10-11 DIAGNOSIS — F331 Major depressive disorder, recurrent, moderate: Secondary | ICD-10-CM

## 2018-10-11 DIAGNOSIS — F419 Anxiety disorder, unspecified: Secondary | ICD-10-CM

## 2018-10-11 DIAGNOSIS — Z880 Allergy status to penicillin: Secondary | ICD-10-CM | POA: Insufficient documentation

## 2018-10-11 DIAGNOSIS — L729 Follicular cyst of the skin and subcutaneous tissue, unspecified: Secondary | ICD-10-CM

## 2018-10-11 DIAGNOSIS — Z8249 Family history of ischemic heart disease and other diseases of the circulatory system: Secondary | ICD-10-CM | POA: Insufficient documentation

## 2018-10-11 DIAGNOSIS — F329 Major depressive disorder, single episode, unspecified: Secondary | ICD-10-CM

## 2018-10-11 DIAGNOSIS — F319 Bipolar disorder, unspecified: Secondary | ICD-10-CM | POA: Insufficient documentation

## 2018-10-11 DIAGNOSIS — Z87891 Personal history of nicotine dependence: Secondary | ICD-10-CM | POA: Insufficient documentation

## 2018-10-11 DIAGNOSIS — Z885 Allergy status to narcotic agent status: Secondary | ICD-10-CM | POA: Insufficient documentation

## 2018-10-11 MED ORDER — MIRTAZAPINE 7.5 MG PO TABS: 7.5000 mg | ORAL_TABLET | Freq: Every day | ORAL | 6 refills | Status: DC

## 2018-10-11 NOTE — Progress Notes (Signed)
Patient ID: Loretta [name], female    DOB: 1961-03-12  MRN: [id number]  CC: Follow-up (4 month)   Subjective: Loretta [name] is a 58 y.o. female who presents for chronic ds management. Her concerns today include:  Pt with hx of Bipolar disorder, reactive depression, arthritis, insomnia  Depressed over the holidays.  Her daughter died in 03-29-2023 of last year and this was the first holiday without her daughter.  Patient tearful today.  Requests refill on Remeron.  C/o small "bubble" on anterior neck x 4 mths.  It does not cause any discomfort.  Does not appear to be increasing in size.  C/o callous on both feet.  The one on the ball of the right foot is a little painful.  She used some over-the-counter mask treatment on her feet that was supposed to soften the skin but she has not noticed any difference.  She does have an Writer for callous.  Started a diet called "Noon."  This diet reportedly helps with controlling portion sizes.  She is wanting to get back to 115 pounds even though she is not overweight at this time.  Reportedly had hemorrhoid surgery in 2018.  In process of recovering from that surgery she gained 30+ lbs.  Prior to surgery she was walking 6 miles a day.  She states that she slept a lot better when she was exercising regularly.  She now has a tread climber at home and plans to start using it daily "I feel fat.".   Patient Active Problem List   Diagnosis Date Noted  . Seasonal allergies 07/15/2017  . History of hemorrhoidectomy 07/15/2017  . 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 Fatigue due to depression 10/07/2016  . Bipolar disorder (HCC) 06/20/2016  . GERD (gastroesophageal reflux disease) 07/24/2014     Current Outpatient Medications on File Prior to Visit  Medication Sig Dispense Refill  . AMBULATORY NON FORMULARY MEDICATION Nitroglycerine ointment 0.125 %  Apply a pea sized amount internally four times daily. Dispense 30 GM zero refill (Patient not taking:  Reported on 10/15/2017) 280 mL 1  . cetirizine (ZYRTEC) 10 MG tablet Take 1 tablet (10 mg total) by mouth daily. 30 tablet 3  . diclofenac sodium (VOLTAREN) 1 % GEL Apply 2 g topically 4 (four) times daily. 100 g 5  . fluticasone (FLONASE) 50 MCG/ACT nasal spray Place 2 sprays into both nostrils daily. 16 g 6  . Multiple Vitamins-Minerals (ALIVE WOMENS GUMMY) CHEW Chew 1 capsule by mouth 1 day or 1 dose.     No current facility-administered medications on file prior to visit.     Allergies  Allergen Reactions  . Codeine Itching, Nausea Only and Other (See Comments)    Pt feels like skin is crawling  . Penicillins Itching, Nausea Only and Other (See Comments)    Pt feels like skin is crawling    Social History   Socioeconomic History  . Marital status: Divorced    Spouse name: Not on file  . Number of children: 5  . Years of education: Not on file  . Highest education level: Not on file  Occupational History  . 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  Social Needs  . Financial resource strain: Not on file  . Food insecurity:    Worry: Not on file    Inability: Not on file  . Transportation needs:    Medical: Not on file    Non-medical: Not on file  Tobacco Use  .  Smoking status: Former Smoker    Types: Cigarettes    Last attempt to quit: 02/05/2008    Years since quitting: 10.6  . Smokeless tobacco: Never Used  . Tobacco comment: Use marijuana  Substance and Sexual Activity  . Alcohol use: Yes    Comment: Occassionally  . Drug use: Yes    Types: Marijuana    Comment: Every day for insomnia; relax to sleep  . Sexual activity: Yes    Birth control/protection: Surgical  Lifestyle  . Physical activity:    Days per week: 0 days    Minutes per session: 0 min  . Stress: Only a little  Relationships  . Social connections:    Talks on phone: More than three times a week    Gets together: More than three times a week    Attends religious service: Never     Active member of club or organization: No    Attends meetings of clubs or organizations: Never    Relationship status: Living with partner  . Intimate partner violence:    Fear of current or ex partner: No    Emotionally abused: No    Physically abused: No    Forced sexual activity: No  Other Topics Concern  . Not on file  Social History Narrative  . Not on file    Family History  Problem Relation Age of Onset  . Lung cancer Father   . Diabetes Father   . Cancer Father   . Heart attack Father   . Heart attack Mother   . Cancer Paternal Uncle   . Cancer Paternal Uncle   . Colon cancer Neg Hx   . Colon polyps Neg Hx   . Kidney disease Neg Hx   . Breast cancer Neg Hx     Past Surgical History:  Procedure Laterality Date  . APPENDECTOMY    . HEMORROIDECTOMY  01/21/2017  . TUBAL LIGATION      ROS: Review of Systems Negative except as above. PHYSICAL EXAM: BP 117/77   Pulse 65   Temp 98.1 F (36.7 C) (Oral)   Resp 16   Wt 145 lb 9.6 oz (66 kg)   SpO2 100%   BMI 24.23 kg/m   Wt Readings from Last 3 Encounters:  10/11/18 145 lb 9.6 oz (66 kg)  06/10/18 147 lb 3.2 oz (66.8 kg)  04/22/18 144 lb 6.4 oz (65.5 kg)   Physical Exam  General appearance - alert, well appearing, and in no distress Mental status -patient tearful at times Neck - supple, no significant adenopathy Chest - clear to auscultation, no wheezes, rales or rhonchi, symmetric air entry Heart - normal rate, regular rhythm, normal S1, S2, no murmurs, rubs, clicks or gallops Extremities - peripheral pulses normal, no pedal edema, no clubbing or cyanosis Skin -neck: About a 3 mm soft raised nodule mid anterior neck to the left of the trachea. Small calluses on the ball of both feet.  Depression screen [facility] 2/9 10/11/2018 04/22/2018 01/13/2018  Decreased Interest 1 3 1   Down, Depressed, Hopeless 3 3 1   PHQ - 2 Score 4 6 2   Altered sleeping 3 3 3   Tired, decreased energy 3 3 2   Change in appetite 3 3 1     Feeling bad or failure about yourself  2 3 1   Trouble concentrating 2 3 1   Moving slowly or fidgety/restless 2 3 1   Suicidal thoughts 2 3 0  PHQ-9 Score 21 27 11   Some recent data might  be hidden     ASSESSMENT AND PLAN:  1. Reactive depression Patient reluctantly agreed to meet with LCSW today.  She has declined and continues to decline referral to a behavioral health provider - mirtazapine (REMERON) 7.5 MG tablet; Take 1 tablet (7.5 mg total) by mouth at bedtime.  Dispense: 30 tablet; Refill: 6  2. Bipolar disease, chronic (HCC) See #1 above  3. Pre-ulcerative corn or callous Advised patient that we can refer to podiatry.  She wants to try her electric filer that she has at home and if it continues to bother her she will let me know  4. Cyst of skin Observe for now  5. Insomnia due to other mental disorder - mirtazapine (REMERON) 7.5 MG tablet; Take 1 tablet (7.5 mg total) by mouth at bedtime.  Dispense: 30 tablet; Refill: 6  6.  I encourage her desire to eat healthy and exercise regularly.  Patient informed that it is recommended that we try to get in at least about 150 minutes/wk of some form of aerobic exercise.  However she is not overweight or obese and I recommend that she does not try to get down to 115 pounds as she would probably be underweight Patient was given the opportunity to ask questions.  Patient verbalized understanding of the plan and was able to repeat key elements of the plan.   No orders of the defined types were placed in this encounter.    Requested Prescriptions   Signed Prescriptions Disp Refills  . mirtazapine (REMERON) 7.5 MG tablet 30 tablet 6    Sig: Take 1 tablet (7.5 mg total) by mouth at bedtime.    Return in about 4 months (around 02/09/2019).  Jonah Blueeborah Mackenzy Grumbine, MD, FACP

## 2018-10-12 NOTE — BH Specialist Note (Signed)
Integrated Behavioral Health Follow Up Visit  MRN: [id number]   Name: Loretta [name]  Number of Integrated Behavioral Health Clinician visits: 2/6 Session Start time: 9:10 AM  Session End time: 9:35 AM Total time: 25 minutes  Type of Service: Integrated Behavioral Health- Individual/Family Interpretor:No. Interpretor Name and Language: N/A  SUBJECTIVE: Loretta [name] is a 58 y.o. female accompanied by self Patient was referred by Dr. Laural Benes for depression and anxiety. Patient reports the following symptoms/concerns: Pt reports difficulty managing mental health conditions after the loss of her adult daughter.  Duration of problem: Ongoing; Severity of problem: severe  OBJECTIVE: Mood: Anxious and Depressed and Affect: Tearful Risk of harm to self or others: No plan to harm self or others Pt scored positive on phq9; however, denies current SI/HI/AVH. Protective factors were identified, safety plan discussed, and crisis intervention resources were provided.  LIFE CONTEXT: Family and Social: Pt resides with fiancee, who provides emotional and financial support. She has additional family that resides nearby School/Work: Pt receives financial support from fiancee Self-Care: Pt has implemented healthy changes to her diet to improve water intake and lose weight. She is also becoming more active at home through exercise Life Changes: Pt is grieving the loss of her adult daughter and having difficulty managing mental health  GOALS ADDRESSED: Patient will: 1.  Reduce symptoms of: anxiety and depression  2.  Increase knowledge and/or ability of: self-management skills  3.  Demonstrate ability to: Increase adequate support systems for patient/family, Increase motivation to adhere to plan of care and Begin healthy grieving over loss  INTERVENTIONS: Interventions utilized:  Behavioral Activation and Supportive Counseling Standardized Assessments completed: GAD-7 and PHQ 2&9 with  C-SSRS  ASSESSMENT: Patient currently experiencing depression and anxiety triggered by grief. Pt reports difficulty managing mental health. She receives strong support from family.   Patient participates in medication management through PCP. She has adopted healthy changes to her diet to improve water intake and lose weight. LCSWA discussed the importance of implementing activity to her daily routine and discussed strategies to increase physical activity. Pt scored positive on phq9; however, denies current SI/HI/AVH. Protective factors were identified, safety plan discussed, and crisis intervention resources were provided.  PLAN: 1. Follow up with behavioral health clinician on : Pt was encouraged to contact LCSWA if symptoms worsen or fail to improve to schedule a behavioral appointment at [facility] 2. Behavioral recommendations: LCSWA recommends that pt apply healthy coping skills, continue to take medications as directed by PCP, and utilize provided resources. Pt is encouraged to schedule a follow up appointment 3. Referral(s): Integrated Behavioral Health Services (In Clinic) 4. "From scale of 1-10, how likely are you to follow plan?":   Bridgett Larsson, LCSW 10/16/2018 8:54 AM

## 2018-10-22 ENCOUNTER — Other Ambulatory Visit: Payer: Self-pay

## 2018-10-22 ENCOUNTER — Telehealth: Payer: Self-pay | Admitting: Internal Medicine

## 2018-10-22 DIAGNOSIS — F329 Major depressive disorder, single episode, unspecified: Secondary | ICD-10-CM

## 2018-10-22 DIAGNOSIS — F5105 Insomnia due to other mental disorder: Secondary | ICD-10-CM

## 2018-10-22 DIAGNOSIS — F99 Mental disorder, not otherwise specified: Principal | ICD-10-CM

## 2018-10-22 MED ORDER — MIRTAZAPINE 7.5 MG PO TABS: 7.5000 mg | ORAL_TABLET | Freq: Every day | ORAL | 6 refills | Status: DC

## 2018-10-22 MED FILL — MIRTAZAPINE 7.5 MG TABLET: 7.5 | 30 days supply | Qty: 30 | Fill #0

## 2018-10-26 ENCOUNTER — Ambulatory Visit: Payer: Self-pay | Attending: Internal Medicine

## 2018-11-04 ENCOUNTER — Other Ambulatory Visit: Payer: Self-pay | Admitting: Pharmacist

## 2018-11-04 DIAGNOSIS — R0981 Nasal congestion: Secondary | ICD-10-CM

## 2018-11-04 DIAGNOSIS — J3489 Other specified disorders of nose and nasal sinuses: Principal | ICD-10-CM

## 2018-11-04 MED ORDER — CETIRIZINE HCL 10 MG PO TABS: 10.0000 mg | ORAL_TABLET | Freq: Every day | ORAL | 0 refills | Status: DC

## 2018-11-04 MED FILL — ?CETIRIZINE HCL 10 MG TABLE: 10 | 30 days supply | Qty: 30 | Fill #0

## 2018-11-16 MED FILL — MIRTAZAPINE 7.5 MG TABLET: 7.5 | 30 days supply | Qty: 30 | Fill #1

## 2018-11-18 IMAGING — MG DIGITAL SCREENING BILATERAL MAMMOGRAM WITH CAD
4 series · 4 of 4 positions shown · non-contrast
Comparison: None.

CLINICAL DATA: Screening.

EXAM:
DIGITAL SCREENING BILATERAL MAMMOGRAM WITH CAD

[L MLO]
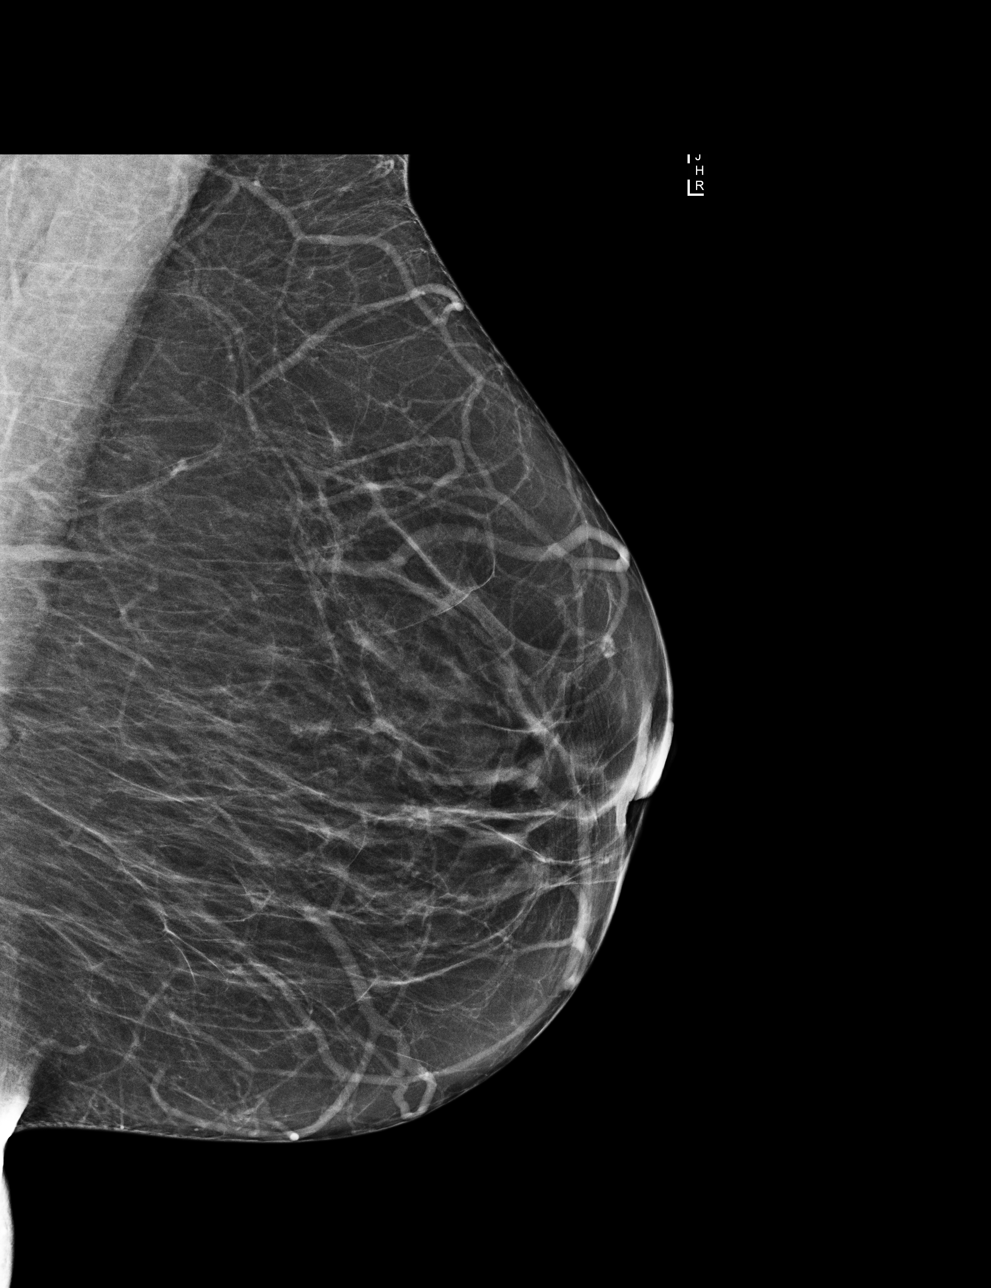

[L CC]
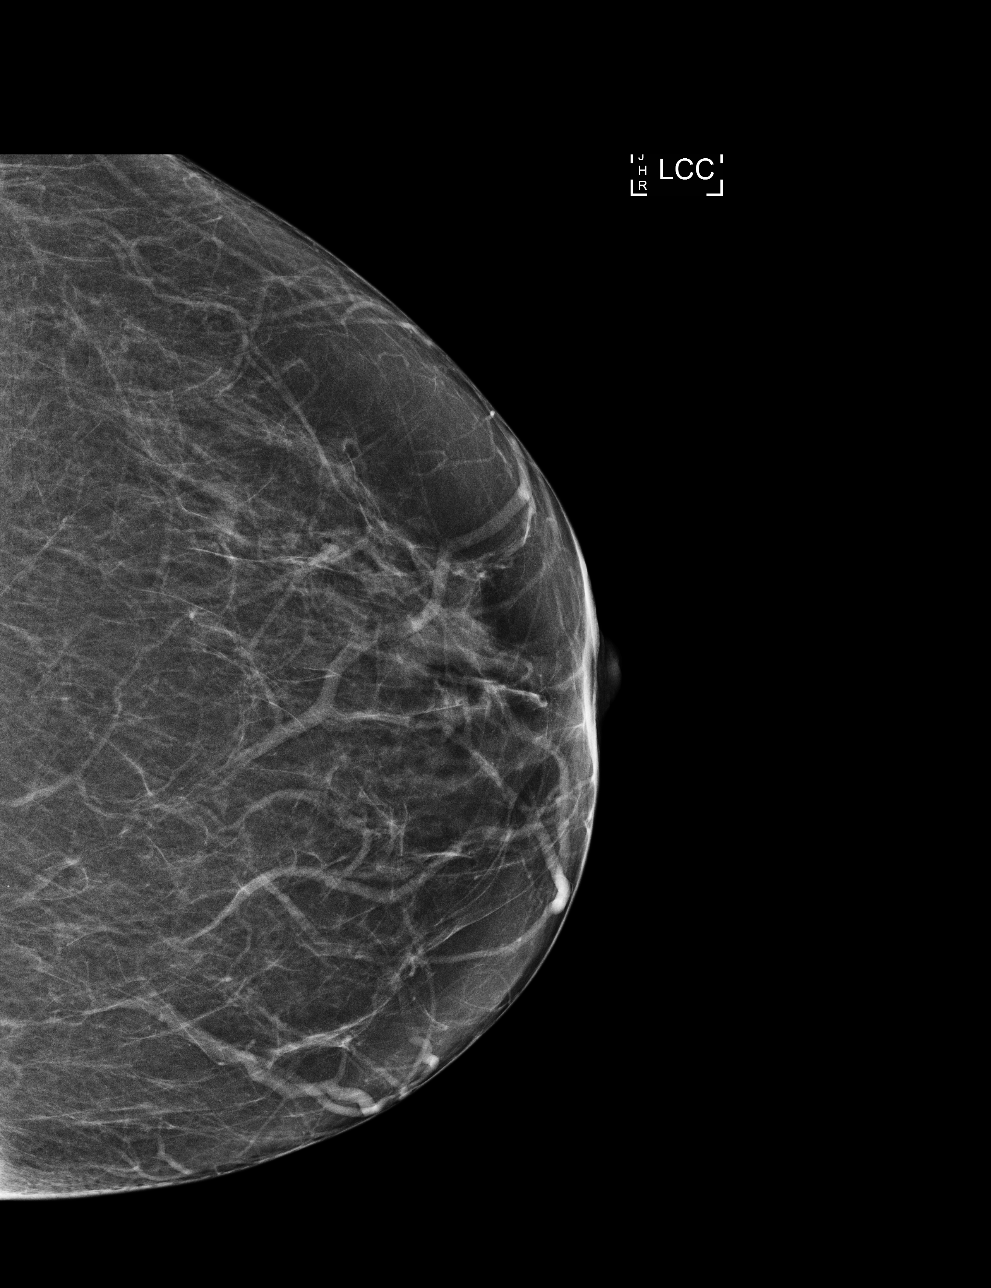

[R MLO]
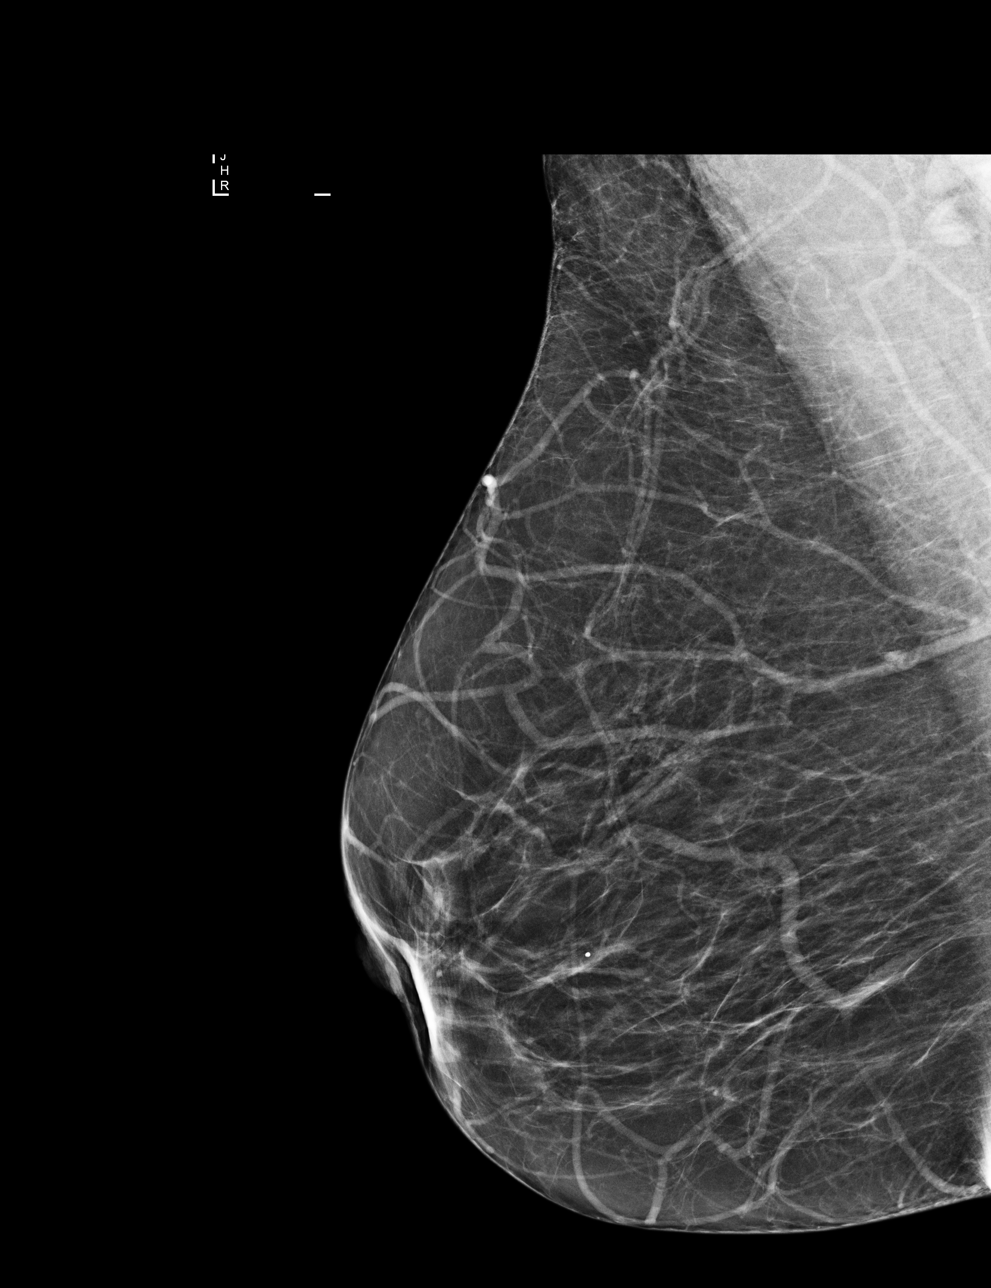

[R CC]
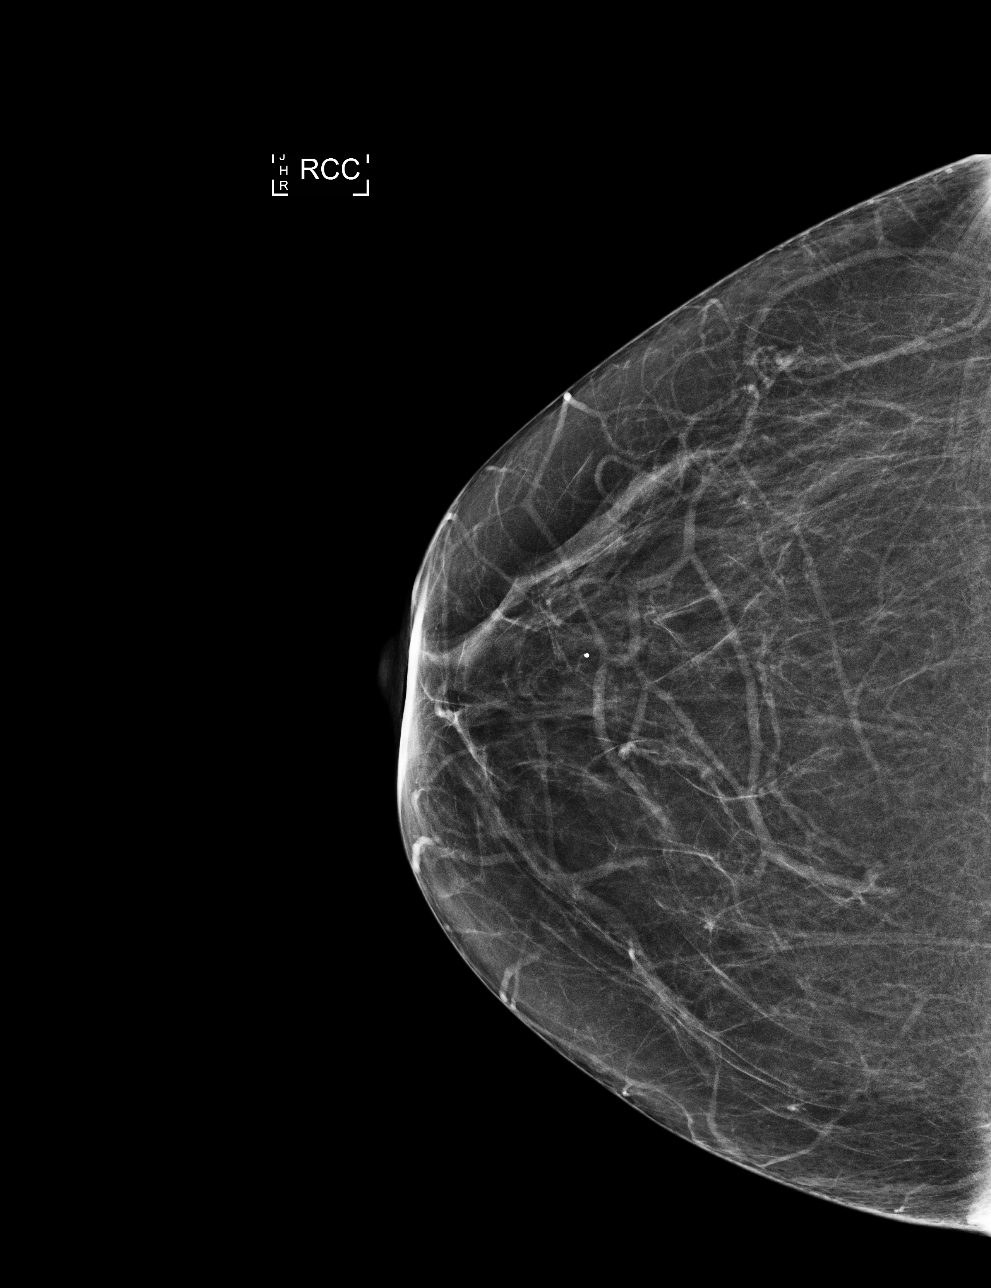

[4 of 4 positions shown; findings below may reference images not displayed]

ACR Breast Density Category b: There are scattered areas of
fibroglandular density.
FINDINGS: There are no findings suspicious for malignancy. Images were
processed with CAD.
IMPRESSION: No mammographic evidence of malignancy. A result letter of this
screening mammogram will be mailed directly to the patient.

RECOMMENDATION:
Screening mammogram in one year. (Code:[id number])

BI-RADS CATEGORY  1: Negative.

## 2018-12-17 MED FILL — MIRTAZAPINE 7.5 MG TABLET: 7.5 | 30 days supply | Qty: 30 | Fill #2

## 2019-01-18 MED FILL — MIRTAZAPINE 7.5 MG TABLET: 7.5 | 30 days supply | Qty: 30 | Fill #3

## 2019-02-10 ENCOUNTER — Encounter: Payer: Self-pay | Admitting: Internal Medicine

## 2019-02-10 ENCOUNTER — Other Ambulatory Visit: Payer: Self-pay

## 2019-02-10 ENCOUNTER — Ambulatory Visit: Payer: Self-pay | Attending: Internal Medicine | Admitting: Internal Medicine

## 2019-02-10 DIAGNOSIS — F5105 Insomnia due to other mental disorder: Secondary | ICD-10-CM

## 2019-02-10 DIAGNOSIS — J301 Allergic rhinitis due to pollen: Secondary | ICD-10-CM

## 2019-02-10 DIAGNOSIS — F329 Major depressive disorder, single episode, unspecified: Secondary | ICD-10-CM

## 2019-02-10 DIAGNOSIS — M19049 Primary osteoarthritis, unspecified hand: Secondary | ICD-10-CM

## 2019-02-10 DIAGNOSIS — F99 Mental disorder, not otherwise specified: Secondary | ICD-10-CM

## 2019-02-10 MED ORDER — FLUTICASONE PROPIONATE 50 MCG/ACT NA SUSP: 2.0000 | Freq: Every day | NASAL | 6 refills | Status: DC

## 2019-02-10 MED ORDER — DICLOFENAC SODIUM 1 % TD GEL: 2.0000 g | Freq: Four times a day (QID) | TRANSDERMAL | 5 refills | Status: DC

## 2019-02-10 MED ORDER — CETIRIZINE HCL 10 MG PO TABS: 10.0000 mg | ORAL_TABLET | Freq: Every day | ORAL | 6 refills | Status: DC

## 2019-02-10 MED ORDER — MIRTAZAPINE 15 MG PO TABS: 15.0000 mg | ORAL_TABLET | Freq: Every day | ORAL | 6 refills | Status: DC

## 2019-02-10 NOTE — Progress Notes (Signed)
Pt states when she first started taking the remeron it was working but now since she has been on it for a while she doesn't feel that it is working anymore.  Pt is requesting a new medication for remeron

## 2019-02-10 NOTE — Progress Notes (Signed)
Virtual Visit via Telephone Note Due to current restrictions/limitations of in-office visits due to the COVID-19 pandemic, this scheduled clinical appointment was converted to a telehealth visit  I connected with Loretta [name] on 02/10/19 at 8:39 a.m EDT by telephone and verified that I am speaking with the correct person using two identifiers. I am in my office. Loretta [name] The patient is at home.  Only the patient and myself participated in this encounter.  I discussed the limitations, risks, security and privacy concerns of performing an evaluation and management service by telephone and the availability of in person appointments. I also discussed with the patient that there may be a patient responsible charge related to this service. The patient expressed understanding and agreed to proceed.   History of Present Illness: Pt with hx of Bipolar disorder,reactive depression,arthritis, insomnia.  Last seen in January.  Depression:  Feeling down because the anniversary of daughter's death coming up in several wks. Also sad that she has not been able to see her grandchildren due to stay at home order.  However, she is grateful for her health.  No suicidal ideation.  Feels Remeron helps but request higher dose.    Allergies bothering her - itchy eyes and throat, sneezing.  Requests refill on Zyrtec and Flonase.  Requests refill on Voltaren gel to use for arthritis symptoms in the hands.     Observations/Objective: [facility]/11/2018 04/22/2018 01/13/2018  Decreased Interest 1 3 1   Down, Depressed, Hopeless 3 3 1   PHQ - 2 Score 4 6 2   Altered sleeping 3 3 3   Tired, decreased energy 3 3 2   Change in appetite 3 3 1   Feeling bad or failure about yourself  2 3 1   Trouble concentrating 2 3 1   Moving slowly or fidgety/restless 2 3 1   Suicidal thoughts 2 3 0  PHQ-9 Score 21 27 11   Some recent data might be hidden       Chemistry      Component Value Date/Time   NA 142 06/10/2018  1131   K 4.7 06/10/2018 1131   CL 100 06/10/2018 1131   CO2 24 06/10/2018 1131   BUN 29 (H) 06/10/2018 1131   CREATININE 0.94 06/10/2018 1131   CREATININE 0.73 07/23/2016 1041      Component Value Date/Time   CALCIUM 9.9 06/10/2018 1131   ALKPHOS 53 06/10/2018 1131   AST 24 06/10/2018 1131   ALT 23 06/10/2018 1131   BILITOT 0.2 06/10/2018 1131     Lab Results  Component Value Date   WBC 7.0 10/07/2016   HGB 13.7 10/07/2016   HCT 41.8 10/07/2016   MCV 85.8 10/07/2016   PLT 348 10/07/2016     Assessment and Plan: 1. Reactive depression Patient has good support from her boyfriend. Encouraged her to try to connect with her grandchildren through face time. Increase Remeron to 15 mg - mirtazapine (REMERON) 15 MG tablet; Take 1 tablet (15 mg total) by mouth at bedtime.  Dispense: 30 tablet; Refill: 6  2. Insomnia due to other mental disorder - mirtazapine (REMERON) 15 MG tablet; Take 1 tablet (15 mg total) by mouth at bedtime.  Dispense: 30 tablet; Refill: 6  3. Seasonal allergic rhinitis due to pollen - fluticasone (FLONASE) 50 MCG/ACT nasal spray; Place 2 sprays into both nostrils daily.  Dispense: 16 g; Refill: 6 - cetirizine (ZYRTEC) 10 MG tablet; Take 1 tablet (10 mg total) by mouth daily.  Dispense: 30 tablet; Refill: 6  4.  Hand arthritis - diclofenac sodium (VOLTAREN) 1 % GEL; Apply 2 g topically 4 (four) times daily.  Dispense: 100 g; Refill: 5   Follow Up Instructions: Follow-up in 3 months   I discussed the assessment and treatment plan with the patient. The patient was provided an opportunity to ask questions and all were answered. The patient agreed with the plan and demonstrated an understanding of the instructions.   The patient was advised to call back or seek an in-person evaluation if the symptoms worsen or if the condition fails to improve as anticipated.  I provided 6 minutes of non-face-to-face time during this encounter.   Jonah Blue, MD

## 2019-02-11 ENCOUNTER — Other Ambulatory Visit: Payer: Self-pay | Admitting: Internal Medicine

## 2019-02-11 DIAGNOSIS — F329 Major depressive disorder, single episode, unspecified: Secondary | ICD-10-CM

## 2019-02-11 DIAGNOSIS — F5105 Insomnia due to other mental disorder: Secondary | ICD-10-CM

## 2019-02-11 MED FILL — ?CETIRIZINE HCL 10 MG TABLE: 10 | 30 days supply | Qty: 30 | Fill #0

## 2019-02-11 MED FILL — FLUTICASONE PROP 50 MCG SPR: 50 | 30 days supply | Qty: 16 | Fill #2

## 2019-02-11 MED FILL — DICLOFENAC SODIUM 1% GEL: 1 | 12 days supply | Qty: 100 | Fill #2

## 2019-02-14 MED ORDER — MIRTAZAPINE 15 MG PO TABS: 15.0000 mg | ORAL_TABLET | Freq: Every day | ORAL | 6 refills | Status: DC

## 2019-02-14 MED FILL — ?MIRTAZAPINE 15MG TAB: 15 | 30 days supply | Qty: 30 | Fill #0

## 2019-03-14 ENCOUNTER — Other Ambulatory Visit: Payer: Self-pay | Admitting: Internal Medicine

## 2019-03-14 DIAGNOSIS — J301 Allergic rhinitis due to pollen: Secondary | ICD-10-CM

## 2019-03-14 MED FILL — ?MIRTAZAPINE 15 MG TAB: 15 | 30 days supply | Qty: 30 | Fill #1

## 2019-03-14 MED FILL — ?CETIRIZINE HCL 10 MG TABLE: 10 | 30 days supply | Qty: 30 | Fill #0

## 2019-04-12 MED FILL — ?MIRTAZAPINE 15 MG TAB: 15 | 30 days supply | Qty: 30 | Fill #2

## 2019-04-12 MED FILL — ?CETIRIZINE HCL 10 MG TABLE: 10 | 30 days supply | Qty: 30 | Fill #1

## 2019-04-12 MED FILL — DICLOFENAC SODIUM 1 % GEL: 1 | 12 days supply | Qty: 100 | Fill #3

## 2019-04-12 MED FILL — FLUTICASONE PROP 50 MCG SPR: 50 | 30 days supply | Qty: 16 | Fill #3

## 2019-05-03 ENCOUNTER — Ambulatory Visit: Payer: Self-pay

## 2019-05-03 ENCOUNTER — Ambulatory Visit: Payer: Self-pay | Attending: Internal Medicine

## 2019-05-03 ENCOUNTER — Other Ambulatory Visit: Payer: Self-pay

## 2019-05-16 MED FILL — MIRTAZAPINE 15 MG TABS: 15 | 30 days supply | Qty: 30 | Fill #3

## 2019-05-16 MED FILL — ?CETIRIZINE HCL 10 MG TABLE: 10 | 30 days supply | Qty: 30 | Fill #2

## 2019-06-02 ENCOUNTER — Ambulatory Visit: Payer: Self-pay | Attending: Internal Medicine | Admitting: Internal Medicine

## 2019-06-02 ENCOUNTER — Encounter: Payer: Self-pay | Admitting: Internal Medicine

## 2019-06-02 ENCOUNTER — Other Ambulatory Visit: Payer: Self-pay

## 2019-06-02 VITALS — BP 113/76 | HR 78 | Temp 98.5°F | Resp 16 | Wt 135.4 lb

## 2019-06-02 DIAGNOSIS — E785 Hyperlipidemia, unspecified: Secondary | ICD-10-CM

## 2019-06-02 DIAGNOSIS — M19049 Primary osteoarthritis, unspecified hand: Secondary | ICD-10-CM

## 2019-06-02 DIAGNOSIS — F329 Major depressive disorder, single episode, unspecified: Secondary | ICD-10-CM

## 2019-06-02 DIAGNOSIS — F5105 Insomnia due to other mental disorder: Secondary | ICD-10-CM

## 2019-06-02 DIAGNOSIS — Z2821 Immunization not carried out because of patient refusal: Secondary | ICD-10-CM

## 2019-06-02 DIAGNOSIS — J301 Allergic rhinitis due to pollen: Secondary | ICD-10-CM

## 2019-06-02 DIAGNOSIS — F99 Mental disorder, not otherwise specified: Secondary | ICD-10-CM

## 2019-06-02 MED ORDER — FLUTICASONE PROPIONATE 50 MCG/ACT NA SUSP: 2.0000 | Freq: Every day | NASAL | 6 refills | Status: DC

## 2019-06-02 MED ORDER — CETIRIZINE HCL 10 MG PO TABS: 10.0000 mg | ORAL_TABLET | Freq: Every day | ORAL | 2 refills | Status: DC

## 2019-06-02 MED ORDER — MIRTAZAPINE 15 MG PO TABS: 22.5000 mg | ORAL_TABLET | Freq: Every day | ORAL | 6 refills | Status: DC

## 2019-06-02 MED ORDER — DICLOFENAC SODIUM 1 % TD GEL: 2.0000 g | Freq: Four times a day (QID) | TRANSDERMAL | 5 refills | Status: DC

## 2019-06-02 MED FILL — DICLOFENAC SODIUM 1% GEL: 1 | 12 days supply | Qty: 100 | Fill #0

## 2019-06-02 MED FILL — FLUTICASONE PROP 50 MCG SPR: 50 | 30 days supply | Qty: 16 | Fill #0

## 2019-06-02 NOTE — Progress Notes (Signed)
Patient ID: Loretta [name], female    DOB: [birth year]  MRN: [id number]  CC: Follow-up   Subjective: Loretta [name] is a 58 y.o. female who presents for chronic ds management Her concerns today include:  Pt with hx of Bipolar disorder,reactive depression,arthritis, insomnia. .  Wgh down 10 lbs.  Walking 4-5 miles every morning "I've tried to cut out a lot of my junk." Using Stevia in coffee rather than sugar  Depression:  "Its still bad and I'm not sleeping as much as before with the Remeron."  Gets in bed at 11 and gets up around 5 a.m.  Drinks coffee only in mornings.  She turns all her lights and sound off. -No suicidal ideation  PAP with neg HPV 32017. Would like to hold of until next year to have Pap.  Really she is not due until 2022  Needing RF on allergy meds and Voltaren gel for arthritis.  Patient Active Problem List   Diagnosis Date Noted  . Seasonal allergies 07/15/2017  . History of hemorrhoidectomy 07/15/2017  . 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 Fatigue due to depression 10/07/2016  . Bipolar disorder ([place]) 06/20/2016  . GERD (gastroesophageal reflux disease) 07/24/2014     Current Outpatient Medications on File Prior to Visit  Medication Sig Dispense Refill  . Multiple Vitamins-Minerals (ALIVE WOMENS GUMMY) CHEW Chew 1 capsule by mouth 1 day or 1 dose.     No current facility-administered medications on file prior to visit.     Allergies  Allergen Reactions  . Codeine Itching, Nausea Only and Other (See Comments)    Pt feels like skin is crawling  . Penicillins Itching, Nausea Only and Other (See Comments)    Pt feels like skin is crawling    Social History   Socioeconomic History  . Marital status: Divorced    Spouse name: Not on file  . Number of children: 5  . Years of education: Not on file  . Highest education level: Not on file  Occupational History  . Occupation: Public house manager: [employer]  Social Needs  . Financial resource  strain: Not on file  . Food insecurity    Worry: Not on file    Inability: Not on file  . Transportation needs    Medical: Not on file    Non-medical: Not on file  Tobacco Use  . Smoking status: Former Smoker    Types: Cigarettes    Quit date: 02/05/2008    Years since quitting: 11.3  . Smokeless tobacco: Never Used  . Tobacco comment: Use marijuana  Substance and Sexual Activity  . Alcohol use: Yes    Comment: Occassionally  . Drug use: Yes    Types: Marijuana    Comment: Every day for insomnia; relax to sleep  . Sexual activity: Yes    Birth control/protection: Surgical  Lifestyle  . Physical activity    Days per week: 0 days    Minutes per session: 0 min  . Stress: Only a little  Relationships  . Social connections    Talks on phone: More than three times a week    Gets together: More than three times a week    Attends religious service: Never    Active member of club or organization: No    Attends meetings of clubs or organizations: Never    Relationship status: Living with partner  . Intimate partner violence    Fear of current or ex partner: No  Emotionally abused: No    Physically abused: No    Forced sexual activity: No  Other Topics Concern  . Not on file  Social History Narrative  . Not on file    Family History  Problem Relation Age of Onset  . Lung cancer Father   . Diabetes Father   . Cancer Father   . Heart attack Father   . Heart attack Mother   . Cancer Paternal Uncle   . Cancer Paternal Uncle   . Colon cancer Neg Hx   . Colon polyps Neg Hx   . Kidney disease Neg Hx   . Breast cancer Neg Hx     Past Surgical History:  Procedure Laterality Date  . APPENDECTOMY    . HEMORROIDECTOMY  01/21/2017  . TUBAL LIGATION      ROS: Review of Systems Negative except as stated above  PHYSICAL EXAM: BP 113/76   Pulse 78   Temp 98.5 F (36.9 C) (Oral)   Resp 16   Wt 135 lb 6.4 oz (61.4 kg)   SpO2 95%   BMI 22.53 kg/m   Wt Readings from  Last 3 Encounters:  06/02/19 135 lb 6.4 oz (61.4 kg)  10/11/18 145 lb 9.6 oz (66 kg)  06/10/18 147 lb 3.2 oz (66.8 kg)     Physical Exam  General appearance - alert, well appearing, and in no distress Mental status - normal mood, behavior, speech, dress, motor activity, and thought processes Neck - supple, no significant adenopathy Chest - clear to auscultation, no wheezes, rales or rhonchi, symmetric air entry Heart - normal rate, regular rhythm, normal S1, S2, no murmurs, rubs, clicks or gallops Extremities - peripheral pulses normal, no pedal edema, no clubbing or cyanosis  Depression screen [facility] 2/9 06/02/2019 10/11/2018 04/22/2018  Decreased Interest 2 1 3   Down, Depressed, Hopeless 3 3 3   PHQ - 2 Score 5 4 6   Altered sleeping 3 3 3   Tired, decreased energy 2 3 3   Change in appetite 3 3 3   Feeling bad or failure about yourself  2 2 3   Trouble concentrating 2 2 3   Moving slowly or fidgety/restless 2 2 3   Suicidal thoughts 1 2 3   PHQ-9 Score 20 21 27   Some recent data might be hidden   GAD 7 : Generalized Anxiety Score 06/02/2019 10/11/2018 04/22/2018 01/13/2018  Nervous, Anxious, on Edge 1 3 3 1   Control/stop worrying 3 3 3 1   Worry too much - different things 3 3 3 1   Trouble relaxing 3 3 3 1   Restless 3 3 3 1   Easily annoyed or irritable 3 3 3 1   Afraid - awful might happen 2 3 3 1   Total GAD 7 Score 18 21 21 7       CMP Latest Ref Rng & Units 06/10/2018 07/23/2016 07/24/2014  Glucose 65 - 99 mg/dL 11(B) 93 147(W)  BUN 6 - 24 mg/dL 29(F) 62(Z) 30(Q)  Creatinine 0.57 - 1.00 mg/dL 6.57 8.46 0.8  Sodium 962 - 144 mmol/L 142 139 138  Potassium 3.5 - 5.2 mmol/L 4.7 4.3 3.8  Chloride 96 - 106 mmol/L 100 102 100  CO2 20 - 29 mmol/L 24 25 22   Calcium 8.7 - 10.2 mg/dL 9.9 9.2 9.6  Total Protein 6.0 - 8.5 g/dL 7.4 7.5 8.2  Total Bilirubin 0.0 - 1.2 mg/dL 0.2 0.3 0.6  Alkaline Phos 39 - 117 IU/L 53 54 44  AST 0 - 40 IU/L 24 13 18   ALT 0 -  32 IU/L 23 13 16    Lipid Panel      Component Value Date/Time   CHOL 213 (H) 06/10/2018 1131   TRIG 114 06/10/2018 1131   HDL 85 06/10/2018 1131   CHOLHDL 2.5 06/10/2018 1131   LDLCALC 105 (H) 06/10/2018 1131    CBC    Component Value Date/Time   WBC 7.0 10/07/2016 0932   RBC 4.87 10/07/2016 0932   HGB 13.7 10/07/2016 0932   HCT 41.8 10/07/2016 0932   PLT 348 10/07/2016 0932   MCV 85.8 10/07/2016 0932   MCH 28.1 10/07/2016 0932   MCHC 32.8 10/07/2016 0932   RDW 14.8 10/07/2016 0932   LYMPHSABS 2,436 07/10/2016 1054   MONOABS 756 07/10/2016 1054   EOSABS 168 07/10/2016 1054   BASOSABS 0 07/10/2016 1054    ASSESSMENT AND PLAN: 1. Insomnia due to other mental disorder Patient would like to try a higher dose of Remeron.  We have increase it to 15 mg 1-1/2 tablets daily. - CBC - Comprehensive metabolic panel - mirtazapine (REMERON) 15 MG tablet; Take 1.5 tablets (22.5 mg total) by mouth at bedtime.  Dispense: 45 tablet; Refill: 6  2. Reactive depression - mirtazapine (REMERON) 15 MG tablet; Take 1.5 tablets (22.5 mg total) by mouth at bedtime.  Dispense: 45 tablet; Refill: 6  3. Hand arthritis - diclofenac sodium (VOLTAREN) 1 % GEL; Apply 2 g topically 4 (four) times daily.  Dispense: 100 g; Refill: 5  4. Seasonal allergic rhinitis due to pollen - cetirizine (ZYRTEC) 10 MG tablet; Take 1 tablet (10 mg total) by mouth daily.  Dispense: 30 tablet; Refill: 2 - fluticasone (FLONASE) 50 MCG/ACT nasal spray; Place 2 sprays into both nostrils daily.  Dispense: 16 g; Refill: 6  5. Hyperlipidemia, unspecified hyperlipidemia type - Lipid panel  6. Influenza vaccination declined  Patient was given the opportunity to ask questions.  Patient verbalized understanding of the plan and was able to repeat key elements of the plan.   Orders Placed This Encounter  Procedures  . CBC  . Comprehensive metabolic panel  . Lipid panel     Requested Prescriptions   Signed Prescriptions Disp Refills  . cetirizine (ZYRTEC)  10 MG tablet 30 tablet 2    Sig: Take 1 tablet (10 mg total) by mouth daily.  . diclofenac sodium (VOLTAREN) 1 % GEL 100 g 5    Sig: Apply 2 g topically 4 (four) times daily.  . fluticasone (FLONASE) 50 MCG/ACT nasal spray 16 g 6    Sig: Place 2 sprays into both nostrils daily.  . mirtazapine (REMERON) 15 MG tablet 45 tablet 6    Sig: Take 1.5 tablets (22.5 mg total) by mouth at bedtime.    Return in about 4 months (around 10/02/2019).  Jonah Blueeborah Crystie Yanko, MD, FACP

## 2019-06-02 NOTE — Patient Instructions (Signed)
Increase Remeron to 1 1/2 tablets daily.

## 2019-06-03 LAB — COMPREHENSIVE METABOLIC PANEL
ALT: 14 IU/L (ref 0–32)
AST: 19 IU/L (ref 0–40)
Albumin/Globulin Ratio: 2.1 (ref 1.2–2.2)
Albumin: 4.9 g/dL (ref 3.8–4.9)
Alkaline Phosphatase: 72 IU/L (ref 39–117)
BUN/Creatinine Ratio: 22 (ref 9–23)
BUN: 18 mg/dL (ref 6–24)
Bilirubin Total: 0.3 mg/dL (ref 0.0–1.2)
CO2: 25 mmol/L (ref 20–29)
Calcium: 10 mg/dL (ref 8.7–10.2)
Chloride: 97 mmol/L (ref 96–106)
Creatinine, Ser: 0.81 mg/dL (ref 0.57–1.00)
GFR calc Af Amer: 92 mL/min/{1.73_m2} (ref >59)
GFR calc non Af Amer: 80 mL/min/{1.73_m2} (ref >59)
Globulin, Total: 2.3 g/dL (ref 1.5–4.5)
Glucose: 85 mg/dL (ref 65–99)
Potassium: 4.8 mmol/L (ref 3.5–5.2)
Sodium: 137 mmol/L (ref 134–144)
Total Protein: 7.2 g/dL (ref 6.0–8.5)

## 2019-06-03 LAB — CBC
Hematocrit: 43.4 % (ref 34.0–46.6)
Hemoglobin: 14.1 g/dL (ref 11.1–15.9)
MCH: 30.1 pg (ref 26.6–33.0)
MCHC: 32.5 g/dL (ref 31.5–35.7)
MCV: 93 fL (ref 79–97)
Platelets: 274 10*3/uL (ref 150–450)
RBC: 4.69 x10E6/uL (ref 3.77–5.28)
RDW: 12.9 % (ref 11.7–15.4)
WBC: 7.8 10*3/uL (ref 3.4–10.8)

## 2019-06-03 LAB — LIPID PANEL
Chol/HDL Ratio: 2.3 ratio (ref 0.0–4.4)
Cholesterol, Total: 194 mg/dL (ref 100–199)
HDL: 85 mg/dL (ref >39)
LDL Calculated: 97 mg/dL (ref 0–99)
Triglycerides: 58 mg/dL (ref 0–149)
VLDL Cholesterol Cal: 12 mg/dL (ref 5–40)

## 2019-06-09 MED FILL — ?MIRTAZAPINE 15MG TABLET: 15 | 30 days supply | Qty: 45 | Fill #0

## 2019-06-09 MED FILL — ?CETIRIZINE HCL 10 MG TABLE: 10 | 30 days supply | Qty: 30 | Fill #0

## 2019-07-07 MED FILL — ?CETIRIZINE HCL 10 MG TABLE: 10 | 30 days supply | Qty: 30 | Fill #1

## 2019-07-07 MED FILL — ?MIRTAZAPINE 15 MG TABLET: 15 | 30 days supply | Qty: 45 | Fill #1

## 2019-08-05 MED FILL — ?MIRTAZAPINE 15 MG TABLET: 15 | 30 days supply | Qty: 45 | Fill #2

## 2019-08-05 MED FILL — ?CETIRIZINE HCL 10 MG TABLE: 10 | 30 days supply | Qty: 30 | Fill #2

## 2019-09-07 MED FILL — ?MIRTAZAPINE 15 MG TABLET: 15 | 30 days supply | Qty: 45 | Fill #3

## 2019-10-03 ENCOUNTER — Ambulatory Visit: Payer: Self-pay | Admitting: Internal Medicine

## 2019-10-04 ENCOUNTER — Ambulatory Visit: Payer: Self-pay | Admitting: Internal Medicine

## 2019-10-05 ENCOUNTER — Encounter: Payer: Self-pay | Admitting: Internal Medicine

## 2019-10-05 ENCOUNTER — Other Ambulatory Visit: Payer: Self-pay

## 2019-10-05 ENCOUNTER — Ambulatory Visit: Payer: Self-pay | Attending: Internal Medicine | Admitting: Internal Medicine

## 2019-10-05 DIAGNOSIS — F5105 Insomnia due to other mental disorder: Secondary | ICD-10-CM

## 2019-10-05 DIAGNOSIS — F329 Major depressive disorder, single episode, unspecified: Secondary | ICD-10-CM

## 2019-10-05 DIAGNOSIS — J3081 Allergic rhinitis due to animal (cat) (dog) hair and dander: Secondary | ICD-10-CM

## 2019-10-05 DIAGNOSIS — M19049 Primary osteoarthritis, unspecified hand: Secondary | ICD-10-CM

## 2019-10-05 DIAGNOSIS — F99 Mental disorder, not otherwise specified: Secondary | ICD-10-CM

## 2019-10-05 MED ORDER — MIRTAZAPINE 30 MG PO TABS: 30.0000 mg | ORAL_TABLET | Freq: Every day | ORAL | 5 refills | Status: DC

## 2019-10-05 MED ORDER — DICLOFENAC SODIUM 1 % EX GEL: 4.0000 g | Freq: Four times a day (QID) | CUTANEOUS | 5 refills | Status: DC

## 2019-10-05 MED ORDER — CETIRIZINE HCL 10 MG PO TABS: 10.0000 mg | ORAL_TABLET | Freq: Every day | ORAL | 5 refills | Status: DC

## 2019-10-05 MED FILL — DICLOFENAC SODIUM 1% GEL: 1 | 12 days supply | Qty: 100 | Fill #1

## 2019-10-05 MED FILL — ?CETIRIZINE HCL 10 MG TABLE: 10 | 30 days supply | Qty: 30 | Fill #0

## 2019-10-05 MED FILL — ?MIRTAZAPINE 30 MG TABLET: 30 | 30 days supply | Qty: 30 | Fill #0

## 2019-10-05 NOTE — Progress Notes (Signed)
Virtual Visit via Telephone Note Due to current restrictions/limitations of in-office visits due to the COVID-19 pandemic, this scheduled clinical appointment was converted to a telehealth visit  I connected with Loretta [name] on 10/05/19 at 9:50 a.m EST by telephone and verified that I am speaking with the correct person using two identifiers. I am in my office.  The patient is at home.  Only the patient and myself participated in this encounter.  I discussed the limitations, risks, security and privacy concerns of performing an evaluation and management service by telephone and the availability of in person appointments. I also discussed with the patient that there may be a patient responsible charge related to this service. The patient expressed understanding and agreed to proceed.  History of Present Illness: Pt with hx of Bipolar disorder,reactive depression,arthritis, insomnia.  Patient was last seen 05/2019.  Purpose of today's visit is chronic disease management.  Depression:  Holidays have been tough since her daughter died. Also had 3 deaths in her extended family in the past several mths.  She feels the ongoing pandemic also makes it difficult.  Her significant other works a lot.  She was walking daily up until a few wks ago.  Just has not felt like getting out of her pajamas.  She would like to have the Remeron increased to 30 mg a day.  No SI.  She has 2 cats and three dogs at home which keep her busy and helps with her mood.  PHQ-9 score today has improved compared to 4 months ago.  It is 15 compared to 20 back in August.  She request RF on Zyrtec.  She has 2 cats that she is allergic to but has not thought about giving them up.  Zyrtec helps keep her allergies under control.  She requests refill on Voltaren gel which she uses for arthritis in her hands. Outpatient Encounter Medications as of 10/05/2019  Medication Sig  . cetirizine (ZYRTEC) 10 MG tablet Take 1 tablet (10 mg total)  by mouth daily.  . diclofenac sodium (VOLTAREN) 1 % GEL Apply 2 g topically 4 (four) times daily.  . fluticasone (FLONASE) 50 MCG/ACT nasal spray Place 2 sprays into both nostrils daily.  . mirtazapine (REMERON) 15 MG tablet Take 1.5 tablets (22.5 mg total) by mouth at bedtime.  . Multiple Vitamins-Minerals (ALIVE WOMENS GUMMY) CHEW Chew 1 capsule by mouth 1 day or 1 dose.   No facility-administered encounter medications on file as of 10/05/2019.    Observations/Objective: Results for orders placed or performed in visit on 06/02/19  CBC  Result Value Ref Range   WBC 7.8 3.4 - 10.8 x10E3/uL   RBC 4.69 3.77 - 5.28 x10E6/uL   Hemoglobin 14.1 11.1 - 15.9 g/dL   Hematocrit 43.4 34.0 - 46.6 %   MCV 93 79 - 97 fL   MCH 30.1 26.6 - 33.0 pg   MCHC 32.5 31.5 - 35.7 g/dL   RDW 12.9 11.7 - 15.4 %   Platelets 274 150 - 450 x10E3/uL  Comprehensive metabolic panel  Result Value Ref Range   Glucose 85 65 - 99 mg/dL   BUN 18 6 - 24 mg/dL   Creatinine, Ser 0.81 0.57 - 1.00 mg/dL   GFR calc non Af Amer 80 >59 mL/min/1.73   GFR calc Af Amer 92 >59 mL/min/1.73   BUN/Creatinine Ratio 22 9 - 23   Sodium 137 134 - 144 mmol/L   Potassium 4.8 3.5 - 5.2 mmol/L   Chloride 97  96 - 106 mmol/L   CO2 25 20 - 29 mmol/L   Calcium 10.0 8.7 - 10.2 mg/dL   Total Protein 7.2 6.0 - 8.5 g/dL   Albumin 4.9 3.8 - 4.9 g/dL   Globulin, Total 2.3 1.5 - 4.5 g/dL   Albumin/Globulin Ratio 2.1 1.2 - 2.2   Bilirubin Total 0.3 0.0 - 1.2 mg/dL   Alkaline Phosphatase 72 39 - 117 IU/L   AST 19 0 - 40 IU/L   ALT 14 0 - 32 IU/L  Lipid panel  Result Value Ref Range   Cholesterol, Total 194 100 - 199 mg/dL   Triglycerides 58 0 - 149 mg/dL   HDL 85 >41 mg/dL   VLDL Cholesterol Cal 12 5 - 40 mg/dL   LDL Calculated 97 0 - 99 mg/dL   Chol/HDL Ratio 2.3 0.0 - 4.4 ratio   Depression screen [facility] 2/9 10/05/2019 06/02/2019 10/11/2018  Decreased Interest 0 2 1  Down, Depressed, Hopeless 3 3 3   PHQ - 2 Score 3 5 4   Altered sleeping  2 3 3   Tired, decreased energy 3 2 3   Change in appetite 2 3 3   Feeling bad or failure about yourself  2 2 2   Trouble concentrating 3 2 2   Moving slowly or fidgety/restless 0 2 2  Suicidal thoughts 0 1 2  PHQ-9 Score 15 20 21   Some recent data might be hidden     Assessment and Plan: 1. Reactive depression -We will increase the Remeron to 30 mg. -Discussed possibility of her getting some counseling or having our LCSW follow-up with her.  Patient declines at this time. -I have encouraged her to start walking again as getting out to catch fresh air and see the sunlight will help in terms of decreasing depressed mood.  She tells me she will try to get out again - mirtazapine (REMERON) 30 MG tablet; Take 1 tablet (30 mg total) by mouth at bedtime.  Dispense: 30 tablet; Refill: 5  2. 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  Dispense: 30 tablet; Refill: 5  3. Hand arthritis - diclofenac Sodium (VOLTAREN) 1 % GEL; Apply 4 g topically 4 (four) times daily.  Dispense: 100 g; Refill: 5    Follow Up Instructions: 4 mths   I discussed the assessment and treatment plan with the patient. The patient was provided an opportunity to ask questions and all were answered. The patient agreed with the plan and demonstrated an understanding of the instructions.   The patient was advised to call back or seek an in-person evaluation if the symptoms worsen or if the condition fails to improve as anticipated.  I provided 10 minutes of non-face-to-face time during this encounter.   , MD

## 2019-10-05 NOTE — Progress Notes (Signed)
Pt is needing a new rx for voltaren gel

## 2019-11-07 MED FILL — ?CETIRIZINE HCL 10 MG TABLE: 10 | 30 days supply | Qty: 30 | Fill #1

## 2019-11-07 MED FILL — ?MIRTAZAPINE 30 MG TABLET: 30 | 30 days supply | Qty: 30 | Fill #1

## 2019-11-18 ENCOUNTER — Other Ambulatory Visit: Payer: Self-pay

## 2019-11-18 ENCOUNTER — Ambulatory Visit: Payer: Self-pay | Attending: Internal Medicine

## 2019-12-05 MED FILL — ?MIRTAZAPINE 30 MG TABLET: 30 | 30 days supply | Qty: 30 | Fill #2

## 2019-12-05 MED FILL — ?CETIRIZINE HCL 10 MG TABLE: 10 | 30 days supply | Qty: 30 | Fill #2

## 2019-12-16 ENCOUNTER — Other Ambulatory Visit: Payer: Self-pay

## 2019-12-16 ENCOUNTER — Ambulatory Visit: Payer: Self-pay | Attending: Internal Medicine | Admitting: Internal Medicine

## 2019-12-16 DIAGNOSIS — Z538 Procedure and treatment not carried out for other reasons: Secondary | ICD-10-CM

## 2019-12-16 NOTE — Progress Notes (Signed)
  This was supposed to be a telephone visit.  Patient did speak with my CMA earlier.  I tried to call her around 4:35 PM and I got her voicemail.  I will have my CMA reschedule her appointment.

## 2020-01-03 MED FILL — ?CETIRIZINE HCL 10 MG TABLE: 10 | 30 days supply | Qty: 30 | Fill #3

## 2020-01-03 MED FILL — ?MIRTAZAPINE 30 MG TABLET: 30 | 30 days supply | Qty: 30 | Fill #3

## 2020-01-31 ENCOUNTER — Ambulatory Visit: Payer: Self-pay | Attending: Internal Medicine | Admitting: Internal Medicine

## 2020-01-31 ENCOUNTER — Other Ambulatory Visit: Payer: Self-pay

## 2020-01-31 ENCOUNTER — Encounter: Payer: Self-pay | Admitting: Internal Medicine

## 2020-01-31 DIAGNOSIS — Z7189 Other specified counseling: Secondary | ICD-10-CM

## 2020-01-31 DIAGNOSIS — Z1231 Encounter for screening mammogram for malignant neoplasm of breast: Secondary | ICD-10-CM

## 2020-01-31 DIAGNOSIS — F331 Major depressive disorder, recurrent, moderate: Secondary | ICD-10-CM

## 2020-01-31 NOTE — Patient Instructions (Signed)

## 2020-01-31 NOTE — Progress Notes (Signed)
Virtual Visit via Telephone Note Due to current restrictions/limitations of in-office visits due to the COVID-19 pandemic, this scheduled clinical appointment was converted to a telehealth visit  I connected with Loretta [name] on 01/31/20 at 9:48 a.m by telephone and verified that I am speaking with the correct person using two identifiers. I am in my office.  The patient is at home.  Only the patient and myself participated in this encounter.  I discussed the limitations, risks, security and privacy concerns of performing an evaluation and management service by telephone and the availability of in person appointments. I also discussed with the patient that there may be a patient responsible charge related to this service. The patient expressed understanding and agreed to proceed.   History of Present Illness: Pt with hx of Bipolar disorder,reactive depression,arthritis, insomnia.   Last evaluation 09/2019.  Purpose of today's visit is chronic disease management.  HM: On the fence about getting COVID vaccine.  She continues to wear mask in public. Due for MMG  Depression: tells me she is coming into the time of yr when her daughter passed away "so I don't have a good time of the yr any more."  Still gets joy in taking care of her dogs and cats.  Feeling better since it is spring and she is able to get out doors more. She spends a lot of time in her veggie/herb garden and flower beds.  Doing okay on Remeron.   Outpatient Encounter Medications as of 01/31/2020  Medication Sig  . cetirizine (ZYRTEC) 10 MG tablet Take 1 tablet (10 mg total) by mouth daily.  . diclofenac Sodium (VOLTAREN) 1 % GEL Apply 4 g topically 4 (four) times daily.  . fluticasone (FLONASE) 50 MCG/ACT nasal spray Place 2 sprays into both nostrils daily.  . mirtazapine (REMERON) 30 MG tablet Take 1 tablet (30 mg total) by mouth at bedtime.  . Multiple Vitamins-Minerals (ALIVE WOMENS GUMMY) CHEW Chew 1 capsule by mouth 1 day  or 1 dose.   No facility-administered encounter medications on file as of 01/31/2020.    Observations/Objective:  Depression screen [facility] 2/9 01/31/2020 12/16/2019 10/05/2019  Decreased Interest - 0 0  Down, Depressed, Hopeless 3 0 3  PHQ - 2 Score 3 0 3  Altered sleeping 2 - 2  Tired, decreased energy 2 - 3  Change in appetite 2 - 2  Feeling bad or failure about yourself  1 - 2  Trouble concentrating 1 - 3  Moving slowly or fidgety/restless 0 - 0  Suicidal thoughts 0 - 0  PHQ-9 Score 11 - 15  Some recent data might be hidden   GAD 7 : Generalized Anxiety Score 01/31/2020 12/16/2019 10/05/2019 06/02/2019  Nervous, Anxious, on Edge 0 1 3 1   Control/stop worrying 3 1 3 3   Worry too much - different things 3 1 3 3   Trouble relaxing 1 1 3 3   Restless 1 0 3 3  Easily annoyed or irritable 1 1 3 3   Afraid - awful might happen 0 0 3 2  Total GAD 7 Score 9 5 21 18      Results for orders placed or performed in visit on 06/02/19  CBC  Result Value Ref Range   WBC 7.8 3.4 - 10.8 x10E3/uL   RBC 4.69 3.77 - 5.28 x10E6/uL   Hemoglobin 14.1 11.1 - 15.9 g/dL   Hematocrit  - 46.6 %   MCV 93 79 - 97 fL   MCH 30.1 26.6 - 33.0  pg   MCHC 32.5 31.5 - 35.7 g/dL   RDW 12.9 11.7 - 15.4 %   Platelets 274 150 - 450 x10E3/uL  Comprehensive metabolic panel  Result Value Ref Range   Glucose 85 65 - 99 mg/dL   BUN 18 6 - 24 mg/dL   Creatinine, Ser 0.81 0.57 - 1.00 mg/dL   GFR calc non Af Amer 80 >59 mL/min/1.73   GFR calc Af Amer 92 >59 mL/min/1.73   BUN/Creatinine Ratio 22 9 - 23   Sodium 137 134 - 144 mmol/L   Potassium 4.8 3.5 - 5.2 mmol/L   Chloride 97 96 - 106 mmol/L   CO2 25 20 - 29 mmol/L   Calcium 10.0 8.7 - 10.2 mg/dL   Total Protein 7.2 6.0 - 8.5 g/dL   Albumin 4.9 3.8 - 4.9 g/dL   Globulin, Total 2.3 1.5 - 4.5 g/dL   Albumin/Globulin Ratio 2.1 1.2 - 2.2   Bilirubin Total 0.3 0.0 - 1.2 mg/dL   Alkaline Phosphatase 72 39 - 117 IU/L   AST 19 0 - 40 IU/L   ALT 14 0 - 32 IU/L   Lipid panel  Result Value Ref Range   Cholesterol, Total 194 100 - 199 mg/dL   Triglycerides 58 0 - 149 mg/dL   HDL 85 >39 mg/dL   VLDL Cholesterol Cal 12 5 - 40 mg/dL   LDL Calculated 97 0 - 99 mg/dL   Chol/HDL Ratio 2.3 0.0 - 4.4 ratio     Assessment and Plan: 1. Moderate episode of recurrent major depressive disorder (HCC) -Continue Remeron.  PHQ-9 score is gradually decreasing.  Encouraged her to continue to find something every day if only for an hour that brings her joy.  2. Encounter for screening mammogram for malignant neoplasm of breast When she comes to pick up medications from the pharmacy later this week, she will ask for my CMA to get the scholarship for mammogram screening  3. Educated about COVID-19 virus infection We spoke about the misgivings that she has on taking the COVID-19 vaccines.  I went over common side effects including pain at the injection site that can last 1 to 2 days and, low-grade fever, muscle aches and headaches that can last for 12 to 24 hours after the second dose of the Pfizer and Moderna vaccines.  She is leaning more towards getting the vaccine   Follow Up Instructions: 4 months   I discussed the assessment and treatment plan with the patient. The patient was provided an opportunity to ask questions and all were answered. The patient agreed with the plan and demonstrated an understanding of the instructions.   The patient was advised to call back or seek an in-person evaluation if the symptoms worsen or if the condition fails to improve as anticipated.  I provided 9 minutes of non-face-to-face time during this encounter.   Karle Plumber, MD

## 2020-02-01 MED FILL — ?MIRTAZAPINE 30 MG TABLET: 30 | 30 days supply | Qty: 30 | Fill #4

## 2020-02-01 MED FILL — ?CETIRIZINE HCL 10 MG TABLE: 10 | 30 days supply | Qty: 30 | Fill #4

## 2020-02-03 ENCOUNTER — Ambulatory Visit: Payer: Self-pay | Admitting: Internal Medicine

## 2020-03-01 MED FILL — MIRTAZAPINE 30 MG TABLET: 30 | 30 days supply | Qty: 30 | Fill #5

## 2020-03-01 MED FILL — ?CETIRIZINE HCL 10 MG TABLE: 10 | 30 days supply | Qty: 30 | Fill #5

## 2020-03-28 ENCOUNTER — Other Ambulatory Visit: Payer: Self-pay

## 2020-03-28 DIAGNOSIS — Z1231 Encounter for screening mammogram for malignant neoplasm of breast: Secondary | ICD-10-CM

## 2020-03-30 ENCOUNTER — Other Ambulatory Visit: Payer: Self-pay | Admitting: Internal Medicine

## 2020-03-30 DIAGNOSIS — J3081 Allergic rhinitis due to animal (cat) (dog) hair and dander: Secondary | ICD-10-CM

## 2020-03-30 DIAGNOSIS — F329 Major depressive disorder, single episode, unspecified: Secondary | ICD-10-CM

## 2020-03-30 MED FILL — MIRTAZAPINE 30 MG TABLET: 30 | 30 days supply | Qty: 30 | Fill #0

## 2020-03-30 MED FILL — ?CETIRIZINE HCL 10 MG TABLE: 10 | 30 days supply | Qty: 30 | Fill #0

## 2020-04-26 ENCOUNTER — Ambulatory Visit
Admission: RE | Admit: 2020-04-26 | Discharge: 2020-04-26 | Disposition: A | Discharge status: Home / Self Care | Payer: No Typology Code available for payment source | Source: Ambulatory Visit | Attending: Internal Medicine | Admitting: Internal Medicine

## 2020-04-26 ENCOUNTER — Other Ambulatory Visit: Payer: Self-pay

## 2020-04-26 DIAGNOSIS — Z1231 Encounter for screening mammogram for malignant neoplasm of breast: Secondary | ICD-10-CM

## 2020-04-27 ENCOUNTER — Telehealth: Payer: Self-pay

## 2020-04-27 NOTE — Telephone Encounter (Signed)
Contacted pt to go over MM results pt is aware and doesn't have any questions or concerns  

## 2020-05-02 MED FILL — MIRTAZAPINE 30 MG TABLET: 30 | 30 days supply | Qty: 30 | Fill #1

## 2020-05-02 MED FILL — ?CETIRIZINE HCL 10 MG TABLE: 10 | 30 days supply | Qty: 30 | Fill #1

## 2020-05-29 ENCOUNTER — Other Ambulatory Visit: Payer: Self-pay | Admitting: Internal Medicine

## 2020-05-29 DIAGNOSIS — F329 Major depressive disorder, single episode, unspecified: Secondary | ICD-10-CM

## 2020-05-29 DIAGNOSIS — J3081 Allergic rhinitis due to animal (cat) (dog) hair and dander: Secondary | ICD-10-CM

## 2020-05-30 MED FILL — ?MIRTAZAPINE 30 MG TABLET: 30 | 30 days supply | Qty: 30 | Fill #0

## 2020-05-30 MED FILL — ?CETIRIZINE HCL 10 MG TABLE: 10 | 30 days supply | Qty: 30 | Fill #0

## 2020-06-01 ENCOUNTER — Encounter: Payer: Self-pay | Admitting: Internal Medicine

## 2020-06-01 ENCOUNTER — Ambulatory Visit: Payer: Self-pay | Attending: Internal Medicine | Admitting: Internal Medicine

## 2020-06-01 ENCOUNTER — Other Ambulatory Visit: Payer: Self-pay

## 2020-06-01 ENCOUNTER — Other Ambulatory Visit: Payer: Self-pay | Admitting: Internal Medicine

## 2020-06-01 VITALS — BP 116/75 | HR 63 | Temp 98.2°F | Resp 18 | Ht 65.0 in | Wt 158.0 lb

## 2020-06-01 DIAGNOSIS — E663 Overweight: Secondary | ICD-10-CM

## 2020-06-01 DIAGNOSIS — M25562 Pain in left knee: Secondary | ICD-10-CM

## 2020-06-01 DIAGNOSIS — Z2821 Immunization not carried out because of patient refusal: Secondary | ICD-10-CM

## 2020-06-01 DIAGNOSIS — F329 Major depressive disorder, single episode, unspecified: Secondary | ICD-10-CM

## 2020-06-01 DIAGNOSIS — M25561 Pain in right knee: Secondary | ICD-10-CM

## 2020-06-01 MED ORDER — MIRTAZAPINE 30 MG PO TABS: 30.0000 mg | ORAL_TABLET | Freq: Every day | ORAL | 3 refills | Status: DC

## 2020-06-01 NOTE — Progress Notes (Signed)
Patient ID: Loretta [name], female    DOB: 1961-01-29  MRN: [id number]  CC: Follow-up   Subjective: Loretta [name] is a 59 y.o. female who presents for chronic ds management Her concerns today include:  Pt with hx of Bipolar disorder,reactive depression,arthritis, insomnia.    HM:  Completed COVID vaccine.   Depression: not getting out much due to COVID pandemic and the heat.  She answered yes to SI ideation on PHQ9 screening sheet. Pt states she is not going to hurt herself but if she goes to bed at nights and don't wake up, it won't bother her.  She still struggles with depression that fluctuates ever since her oldest daughter died 2 years ago.  She has 3 more daughters but has not spoken with 2 of them in 2 yrs. she is on Remeron and feels she is okay on it.  Had to give up her walking because both knees have been bothering her.  She was walking 5 miles a day. Knees ache and stiff.  Uses magnetic knee brace sometimes Still active around her house and goes up and down her steps several times a day.  Also has to take care of her animals -She tells me that she has started eating out of boredom because she does not go anywhere other than to the grocery store due to the Covid pandemic.    -gained 23 lbs in past yr Patient Active Problem List   Diagnosis Date Noted  . Seasonal allergies 07/15/2017  . History of hemorrhoidectomy 07/15/2017  . 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 Fatigue due to depression 10/07/2016  . Bipolar disorder (HCC) 06/20/2016  . GERD (gastroesophageal reflux disease) 07/24/2014     Current Outpatient Medications on File Prior to Visit  Medication Sig Dispense Refill  . cetirizine (ZYRTEC) 10 MG tablet TAKE 1 TABLET (10 MG TOTAL) BY MOUTH DAILY. 30 tablet 0  . diclofenac Sodium (VOLTAREN) 1 % GEL Apply 4 g topically 4 (four) times daily. 100 g 5  . fluticasone (FLONASE) 50 MCG/ACT nasal spray Place 2 sprays into both nostrils daily. 16 g 6  . ibuprofen (ADVIL)  100 MG tablet Take 600 mg by mouth daily.    . mirtazapine (REMERON) 30 MG tablet TAKE 1 TABLET (30 MG TOTAL) BY MOUTH AT BEDTIME. 30 tablet 0  . Multiple Vitamins-Minerals (ALIVE WOMENS GUMMY) CHEW Chew 1 capsule by mouth 1 day or 1 dose.     No current facility-administered medications on file prior to visit.    Allergies  Allergen Reactions  . Codeine Itching, Nausea Only and Other (See Comments)    Pt feels like skin is crawling  . Penicillins Itching, Nausea Only and Other (See Comments)    Pt feels like skin is crawling    Social History   Socioeconomic History  . Marital status: Divorced    Spouse name: Not on file  . Number of children: 5  . Years of education: Not on file  . Highest education level: Not on file  Occupational History  . 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  Tobacco Use  . Smoking status: Former Smoker    Types: Cigarettes    Quit date: 02/05/2008    Years since quitting: 12.3  . Smokeless tobacco: Never Used  . Tobacco comment: Use marijuana  Vaping Use  . Vaping Use: Never used  Substance and Sexual Activity  . Alcohol use: Yes    Comment: Occassionally  . Drug use:  Yes    Types: Marijuana    Comment: Every day for insomnia; relax to sleep  . Sexual activity: Yes    Birth control/protection: Surgical  Other Topics Concern  . Not on file  Social History Narrative  . Not on file   Social Determinants of Health   Financial Resource Strain:   . Difficulty of Paying Living Expenses: Not on file  Food Insecurity:   . Worried About Programme researcher, broadcasting/film/video in the Last Year: Not on file  . Ran Out of Food in the Last Year: Not on file  Transportation Needs:   . Lack of Transportation (Medical): Not on file  . Lack of Transportation (Non-Medical): Not on file  Physical Activity:   . Days of Exercise per Week: Not on file  . Minutes of Exercise per Session: Not on file  Stress:   . Feeling of Stress : Not on file  Social  Connections:   . Frequency of Communication with Friends and Family: Not on file  . Frequency of Social Gatherings with Friends and Family: Not on file  . Attends Religious Services: Not on file  . Active Member of Clubs or Organizations: Not on file  . Attends Banker Meetings: Not on file  . Marital Status: Not on file  Intimate Partner Violence:   . Fear of Current or Ex-Partner: Not on file  . Emotionally Abused: Not on file  . Physically Abused: Not on file  . Sexually Abused: Not on file    Family History  Problem Relation Age of Onset  . Lung cancer Father   . Diabetes Father   . Cancer Father   . Heart attack Father   . Heart attack Mother   . Cancer Paternal Uncle   . Cancer Paternal Uncle   . Colon cancer Neg Hx   . Colon polyps Neg Hx   . Kidney disease Neg Hx   . Breast cancer Neg Hx     Past Surgical History:  Procedure Laterality Date  . APPENDECTOMY    . HEMORROIDECTOMY  01/21/2017  . TUBAL LIGATION      ROS: Review of Systems Negative except as stated above  PHYSICAL EXAM: BP 116/75 (BP Location: Right Arm, Patient Position: Sitting, Cuff Size: Normal)   Pulse 63   Temp 98.2 F (36.8 C) (Oral)   Resp 18   Ht 5\' 5"  (1.651 m)   Wt 158 lb (71.7 kg)   SpO2 99%   BMI 26.29 kg/m   Wt Readings from Last 3 Encounters:  06/01/20 158 lb (71.7 kg)  06/02/19 135 lb 6.4 oz (61.4 kg)  10/11/18 145 lb 9.6 oz (66 kg)   Physical Exam  General appearance - alert, well appearing, and in no distress Mental status - normal mood, behavior, speech, dress, motor activity, and thought processes Chest - clear to auscultation, no wheezes, rales or rhonchi, symmetric air entry Heart - normal rate, regular rhythm, normal S1, S2, no murmurs, rubs, clicks or gallops Musculoskeletal -knees: No significant joint enlargement.  No point tenderness.  No crepitus or discomfort with passive range of motion. Extremities - peripheral pulses normal, no pedal edema,  no clubbing or cyanosis  Depression screen [facility] 2/9 06/01/2020 01/31/2020 12/16/2019  Decreased Interest 2 - 0  Down, Depressed, Hopeless 2 3 0  PHQ - 2 Score 4 3 0  Altered sleeping 3 2 -  Tired, decreased energy 2 2 -  Change in appetite 3 2 -  Feeling bad or failure about yourself  3 1 -  Trouble concentrating 3 1 -  Moving slowly or fidgety/restless 2 0 -  Suicidal thoughts 2 0 -  PHQ-9 Score 22 11 -  Some recent data might be hidden    CMP Latest Ref Rng & Units 06/02/2019 06/10/2018 07/23/2016  Glucose 65 - 99 mg/dL 85 46(F) 93  BUN 6 - 24 mg/dL 18 68(L) 27(N)  Creatinine 0.57 - 1.00 mg/dL 1.70 0.17 4.94  Sodium 134 - 144 mmol/L 137 142 139  Potassium 3.5 - 5.2 mmol/L 4.8 4.7 4.3  Chloride 96 - 106 mmol/L 97 100 102  CO2 20 - 29 mmol/L 25 24 25   Calcium 8.7 - 10.2 mg/dL 9.9 9.2  Total Protein 6.0 - 8.5 g/dL 7.2 7.4 7.5  Total Bilirubin 0.0 - 1.2 mg/dL 0.3 0.2 0.3  Alkaline Phos 39 - 117 IU/L 72 53 54  AST 0 - 40 IU/L 19 24 13   ALT 0 - 32 IU/L 14 23 13    Lipid Panel     Component Value Date/Time   CHOL 194 06/02/2019 1452   TRIG 58 06/02/2019 1452   HDL 85 06/02/2019 1452   CHOLHDL 2.3 06/02/2019 1452   LDLCALC 97 06/02/2019 1452    CBC    Component Value Date/Time   WBC 7.8 06/02/2019 1452   WBC 7.0 10/07/2016 0932   RBC 4.69 06/02/2019 1452   RBC 4.87 10/07/2016 0932   HGB 14.1 06/02/2019 1452   HCT 43.4 06/02/2019 1452   PLT 274 06/02/2019 1452   MCV 93 06/02/2019 1452   MCH 30.1 06/02/2019 1452   MCH 28.1 10/07/2016 0932   MCHC 32.5 06/02/2019 1452   MCHC 32.8 10/07/2016 0932   RDW 12.9 06/02/2019 1452   LYMPHSABS 2,436 07/10/2016 1054   MONOABS 756 07/10/2016 1054   EOSABS 168 07/10/2016 1054   BASOSABS 0 07/10/2016 1054    ASSESSMENT AND PLAN: 1. Major depression, chronic This is been a chronic issue for her.  We have tried to get her to go to behavioral health in the past but she has been reluctant.  She is agreeable for follow-up  conversation with our LCSW.  I will have our LCSW touch base with her. - mirtazapine (REMERON) 30 MG tablet; Take 1 tablet (30 mg total) by mouth at bedtime.  Dispense: 30 tablet; Refill: 3  2. Pain in both knees, unspecified chronicity Patient has Voltaren gel and finds this to be helpful.  She will continue to use it as needed.  I have encouraged her to try to get back out and start walking again at least 3 to 4 days a week.  It will help with her joints and also mental health  3. Influenza vaccination declined This was offered and patient declined  4. Overweight (BMI 25.0-29.9) Discussed healthy eating habits.  Printed information given to the patient. - CBC - Comprehensive metabolic panel - Lipid panel    Patient was given the opportunity to ask questions.  Patient verbalized understanding of the plan and was able to repeat key elements of the plan.   No orders of the defined types were placed in this encounter.    Requested Prescriptions    No prescriptions requested or ordered in this encounter    No follow-ups on file.  09/09/2016, MD, FACP

## 2020-06-01 NOTE — Progress Notes (Signed)
Has fallen "a few times" within the las yr, last fall x a weeks ago  Had COVID vaccine a [facility] on 02/25/20 and 03/24/20

## 2020-06-02 LAB — LIPID PANEL
Chol/HDL Ratio: 2.6 ratio (ref 0.0–4.4)
Cholesterol, Total: 212 mg/dL — ABNORMAL HIGH (ref 100–199)
HDL: 83 mg/dL (ref >39)
LDL Chol Calc (NIH): 113 mg/dL — ABNORMAL HIGH (ref 0–99)
Triglycerides: 89 mg/dL (ref 0–149)
VLDL Cholesterol Cal: 16 mg/dL (ref 5–40)

## 2020-06-02 LAB — CBC
Hematocrit: 38.8 % (ref 34.0–46.6)
Hemoglobin: 13 g/dL (ref 11.1–15.9)
MCH: 30.1 pg (ref 26.6–33.0)
MCHC: 33.5 g/dL (ref 31.5–35.7)
MCV: 90 fL (ref 79–97)
Platelets: 223 10*3/uL (ref 150–450)
RBC: 4.32 x10E6/uL (ref 3.77–5.28)
RDW: 12.8 % (ref 11.7–15.4)
WBC: 7.5 10*3/uL (ref 3.4–10.8)

## 2020-06-02 LAB — COMPREHENSIVE METABOLIC PANEL
ALT: 12 IU/L (ref 0–32)
AST: 19 IU/L (ref 0–40)
Albumin/Globulin Ratio: 2.3 — ABNORMAL HIGH (ref 1.2–2.2)
Albumin: 4.6 g/dL (ref 3.8–4.9)
Alkaline Phosphatase: 49 IU/L (ref 48–121)
BUN/Creatinine Ratio: 15 (ref 12–28)
BUN: 16 mg/dL (ref 8–27)
Bilirubin Total: 0.2 mg/dL (ref 0.0–1.2)
CO2: 26 mmol/L (ref 20–29)
Calcium: 9.4 mg/dL (ref 8.7–10.3)
Chloride: 100 mmol/L (ref 96–106)
Creatinine, Ser: 1.05 mg/dL — ABNORMAL HIGH (ref 0.57–1.00)
GFR calc Af Amer: 67 mL/min/{1.73_m2} (ref >59)
GFR calc non Af Amer: 58 mL/min/{1.73_m2} — ABNORMAL LOW (ref >59)
Globulin, Total: 2 g/dL (ref 1.5–4.5)
Glucose: 84 mg/dL (ref 65–99)
Potassium: 4.7 mmol/L (ref 3.5–5.2)
Sodium: 138 mmol/L (ref 134–144)
Total Protein: 6.6 g/dL (ref 6.0–8.5)

## 2020-06-03 ENCOUNTER — Encounter: Payer: Self-pay | Admitting: Internal Medicine

## 2020-06-03 DIAGNOSIS — E782 Mixed hyperlipidemia: Secondary | ICD-10-CM | POA: Insufficient documentation

## 2020-06-03 NOTE — Progress Notes (Signed)
Let patient know that her blood count is normal meaning no anemia.  Her kidney function is not 100% and decreased compared to 1 year ago.  Try to avoid taking medications like ibuprofen, Aleve, Advil, Naprosyn long-term as these can adversely affect the kidney.  Electrolytes and liver function tests are normal.  LDL cholesterol is 113 with goal being less than 100.  Healthy eating habits and regular exercise will help to lower cholesterol.

## 2020-06-04 ENCOUNTER — Telehealth: Payer: Self-pay

## 2020-06-04 NOTE — Telephone Encounter (Signed)
Contacted pt to go over lab results pt is aware and doesn't have any questions or concerns 

## 2020-06-05 ENCOUNTER — Other Ambulatory Visit: Payer: Self-pay

## 2020-06-05 ENCOUNTER — Ambulatory Visit: Payer: Self-pay

## 2020-06-18 ENCOUNTER — Ambulatory Visit: Payer: Self-pay | Attending: Internal Medicine | Admitting: Licensed Clinical Social Worker

## 2020-06-18 DIAGNOSIS — F329 Major depressive disorder, single episode, unspecified: Secondary | ICD-10-CM

## 2020-06-18 NOTE — BH Specialist Note (Signed)
Integrated Behavioral Health Visit via Telemedicine (Telephone)  06/18/20 Loretta [name] [id number]  Number of Integrated Behavioral Health visits: 1 Session Start time: 9:00 AM  Session End time: 9:20 AM Total time: 20 minutes  Referring Provider: Dr. Laural Benes Type of Service: Individual Patient or Family location: Home [facility] Provider location: Office All persons participating in visit: LCSW and Patient   I connected with Loretta [name] by telephone and verified that I am speaking with the correct person using two identifiers.   Discussed confidentiality: Yes   Confirmed demographics & insurance:  Yes   I discussed that engaging in this virtual visit, they consent to the provision of behavioral healthcare and the services will be billed under their insurance.   Patient and/or legal guardian expressed understanding and consented to virtual visit: Yes   PRESENTING CONCERNS: Patient or family reports the following symptoms/concerns: Pt reports difficulty managing depression triggered by grief and ongoing pain resulting in limited mobility. Pt reports financial strain and feelings of uncertainty regarding pandemic.  Duration of problem: Ongoing; Severity of problem: severe  STRENGTHS (Protective Factors/Coping Skills): Concrete supports in place (healthy food, safe environments, etc.)  ASSESSMENT: Patient currently experiencing symptoms of depression, including, feelings of sadness, low energy and motivation triggered by chronic medical conditions, grief, and financial strain. Pt has hx endorsing passive SI; however, denies current SI/HI.   Pt will benefit from initiating psychotherapy to strengthen support and assist with management of symptoms. Pt was strongly encouraged to continue with medication management.     GOALS ADDRESSED: Patient will: 1.  Increase knowledge and/or ability of: healthy habits Pt agreed to make healthier food choices and continue utilizing air fryer to  promote health 2.  Demonstrate ability to: Increase adequate support systems for patient/family Pt agreed to initiate behavioral health services through [facility]  Progress of Goals: Ongoing  INTERVENTIONS: Interventions utilized:  Solution-Focused Strategies and Link to [employer] Assessments completed & reviewed: Not Needed   OUTCOME: Patient Response: Pt was engaged during session and was successful in identifying strategies.   PLAN: 1. Follow up with behavioral health clinician on : Contact LCSW with any additional behavioral health and/or resource needs 2. Behavioral recommendations: utilize strategies discussed and continue with compliance with medication management 3. Referral(s): [facility] (LME/Outside Clinic)  I discussed the assessment and treatment plan with the patient and/or parent/guardian. They were provided an opportunity to ask questions and all were answered. They agreed with the plan and demonstrated an understanding of the instructions.   They were advised to call back or seek an in-person evaluation as appropriate.  I discussed that the purpose of this visit is to provide behavioral health care while limiting exposure to the novel coronavirus.  Discussed there is a possibility of technology failure and discussed alternative modes of communication if that failure occurs.  Bridgett Larsson, LCSW  07/13/20 11:39 AM

## 2020-06-29 ENCOUNTER — Other Ambulatory Visit: Payer: Self-pay | Admitting: Internal Medicine

## 2020-06-29 DIAGNOSIS — J3081 Allergic rhinitis due to animal (cat) (dog) hair and dander: Secondary | ICD-10-CM

## 2020-06-29 MED FILL — ?CETIRIZINE HCL 10 MG TABLE: 10 | 30 days supply | Qty: 30 | Fill #0

## 2020-06-29 MED FILL — ?MIRTAZAPINE 30 MG TABLET: 30 | 30 days supply | Qty: 30 | Fill #0

## 2020-07-31 MED FILL — ?MIRTAZAPINE 30 MG TABLET: 30 | 30 days supply | Qty: 30 | Fill #1

## 2020-07-31 MED FILL — ?CETIRIZINE HCL 10 MG TABLE: 10 | 30 days supply | Qty: 30 | Fill #1

## 2020-08-27 MED FILL — ?MIRTAZAPINE 30 MG TABLET: 30 | 30 days supply | Qty: 30 | Fill #2

## 2020-09-25 MED FILL — MIRTAZAPINE 30 MG TABLET: 30 | 30 days supply | Qty: 30 | Fill #3

## 2020-10-09 ENCOUNTER — Encounter: Payer: Self-pay | Admitting: Internal Medicine

## 2020-10-09 ENCOUNTER — Ambulatory Visit: Payer: Self-pay | Attending: Internal Medicine | Admitting: Internal Medicine

## 2020-10-09 ENCOUNTER — Other Ambulatory Visit: Payer: Self-pay

## 2020-10-09 ENCOUNTER — Other Ambulatory Visit: Payer: Self-pay | Admitting: Internal Medicine

## 2020-10-09 VITALS — Wt 150.0 lb

## 2020-10-09 DIAGNOSIS — F329 Major depressive disorder, single episode, unspecified: Secondary | ICD-10-CM

## 2020-10-09 DIAGNOSIS — R634 Abnormal weight loss: Secondary | ICD-10-CM

## 2020-10-09 MED ORDER — MIRTAZAPINE 30 MG PO TABS: 30.0000 mg | ORAL_TABLET | Freq: Every day | ORAL | 5 refills | Status: DC

## 2020-10-09 NOTE — Progress Notes (Signed)
Virtual Visit via Telephone Note  I connected with Loretta [name] on 10/09/20 at 10:08 a.m by telephone and verified that I am speaking with the correct person using two identifiers.  Location: Patient: home Provider: office   I discussed the limitations, risks, security and privacy concerns of performing an evaluation and management service by telephone and the availability of in person appointments. I also discussed with the patient that there may be a patient responsible charge related to this service. The patient expressed understanding and agreed to proceed.   History of Present Illness: Pt with hx of Bipolar disorder,reactive depression,arthritis, insomnia.  Loss 8 lbs in 6 wks through using a Wgh management program called Thrive - a daily pill and patch and protein shake. When asked what is in it, pt states it continues a bunch of vitamins.  Reports that she is not craving chocolate as much and has a lot more energy.  Felt a little depressed over the holidays but over all feels good.  Not walking as much due to cold weather.  Her arthritis has not bothered her as much since wgh loss.  Plans to start using her holi-hoop in doors to get in some exercise.  -Still doing good with Remeron.  HM: plans to get COVID booster shot.  Outpatient Encounter Medications as of 10/09/2020  Medication Sig  . cetirizine (ZYRTEC) 10 MG tablet TAKE 1 TABLET (10 MG TOTAL) BY MOUTH DAILY.  Marland Kitchen diclofenac Sodium (VOLTAREN) 1 % GEL Apply 4 g topically 4 (four) times daily.  . fluticasone (FLONASE) 50 MCG/ACT nasal spray Place 2 sprays into both nostrils daily.  Marland Kitchen ibuprofen (ADVIL) 100 MG tablet Take 600 mg by mouth daily.  . mirtazapine (REMERON) 30 MG tablet Take 1 tablet (30 mg total) by mouth at bedtime.  . Multiple Vitamins-Minerals (ALIVE WOMENS GUMMY) CHEW Chew 1 capsule by mouth 1 day or 1 dose.   No facility-administered encounter medications on file as of 10/09/2020.       Observations/Objective: Weight 150 lb (68 kg).  Depression screen [facility] 2/9 10/09/2020 06/01/2020 01/31/2020  Decreased Interest 0 2 -  Down, Depressed, Hopeless 3 2 3   PHQ - 2 Score 3 4 3   Altered sleeping 0 3 2  Tired, decreased energy 0 2 2  Change in appetite 0 3 2  Feeling bad or failure about yourself  0 3 1  Trouble concentrating 0 3 1  Moving slowly or fidgety/restless 0 2 0  Suicidal thoughts 0 2 0  PHQ-9 Score 3 22 11   Some recent data might be hidden     Results for orders placed or performed in visit on 06/01/20  CBC  Result Value Ref Range   WBC 7.5 3.4 - 10.8 x10E3/uL   RBC 4.32 3.77 - 5.28 x10E6/uL   Hemoglobin 13.0 11.1 - 15.9 g/dL   Hematocrit  - 46.6 %   MCV 90 79 - 97 fL   MCH 30.1 26.6 - 33.0 pg   MCHC 33.5 31.5 - 35.7 g/dL   RDW 06/03/20 02.5 - 85.2 %   Platelets 223 150 - 450 x10E3/uL  Comprehensive metabolic panel  Result Value Ref Range   Glucose 84 65 - 99 mg/dL   BUN 16 8 - 27 mg/dL   Creatinine, Ser 77.8 (H) 0.57 - 1.00 mg/dL   GFR calc non Af Amer 58 (L) >59 mL/min/1.73   GFR calc Af Amer 67 >59 mL/min/1.73   BUN/Creatinine Ratio 15 12 - 28  Sodium 138 134 - 144 mmol/L   Potassium 4.7 3.5 - 5.2 mmol/L   Chloride 100 96 - 106 mmol/L   CO2 26 20 - 29 mmol/L   Calcium 9.4 8.7 - 10.3 mg/dL   Total Protein 6.6 6.0 - 8.5 g/dL   Albumin 4.6 3.8 - 4.9 g/dL   Globulin, Total 2.0 1.5 - 4.5 g/dL   Albumin/Globulin Ratio 2.3 (H) 1.2 - 2.2   Bilirubin Total 0.2 0.0 - 1.2 mg/dL   Alkaline Phosphatase 49 48 - 121 IU/L   AST 19 0 - 40 IU/L   ALT 12 0 - 32 IU/L  Lipid panel  Result Value Ref Range   Cholesterol, Total 212 (H) 100 - 199 mg/dL   Triglycerides 89 0 - 149 mg/dL   HDL 83 >91 mg/dL   VLDL Cholesterol Cal 16 5 - 40 mg/dL   LDL Chol Calc (NIH) 638 (H) 0 - 99 mg/dL   Chol/HDL Ratio 2.6 0.0 - 4.4 ratio     Assessment and Plan: 1. Major depression, chronic Patient doing much better mentally and physically.  Encouraged her to continue  healthy eating habits and trying to exercise regularly.  Refill given on Remeron. - mirtazapine (REMERON) 30 MG tablet; Take 1 tablet (30 mg total) by mouth at bedtime.  Dispense: 30 tablet; Refill: 5  2. Weight decrease Commended her on weight loss.  I do encourage healthy eating habits and regular exercise.  Discussed ways to exercise indoors.  I asked that she brings or sends me a copy of the ingredients of the weight management supplement that she is taking and she promises to do so.   Follow Up Instructions: 4 mths   I discussed the assessment and treatment plan with the patient. The patient was provided an opportunity to ask questions and all were answered. The patient agreed with the plan and demonstrated an understanding of the instructions.   The patient was advised to call back or seek an in-person evaluation if the symptoms worsen or if the condition fails to improve as anticipated.  I provided 9 minutes of non-face-to-face time during this encounter.   Jonah Blue, MD

## 2020-10-19 ENCOUNTER — Telehealth: Payer: Self-pay | Admitting: Internal Medicine

## 2020-10-19 NOTE — Telephone Encounter (Signed)
Received envelope and will give to pcp

## 2020-10-19 NOTE — Telephone Encounter (Signed)
Patient came in and dropped off an envelope for her PCP. Patient states it has to do with her healthy weight and management. Envelope will be put in PCP box.

## 2020-10-23 MED FILL — MIRTAZAPINE 30 MG TABLET: 30 | 30 days supply | Qty: 30 | Fill #0

## 2020-10-31 ENCOUNTER — Encounter: Payer: Self-pay | Admitting: Internal Medicine

## 2020-10-31 NOTE — Progress Notes (Signed)
Patient sent me the ingredients list of all 3 Thrive that she takes.  The patch contains Forslean, green coffee bean extract, Garcinia Cambogia, co-Q10, white willow bark, Cosmoperine, Limonene, Aloe Vera and L-arginine. Vitamin pack contines vitamin A, D, B1, B2, niacin, B6, folate, B12, B5, chromium, selenium and Vanadium. The vitamin shake contains various vitamins and minerals along with other ingredients including

## 2020-11-21 MED FILL — MIRTAZAPINE 30 MG TABLET: 30 | 30 days supply | Qty: 30 | Fill #1

## 2020-12-12 ENCOUNTER — Other Ambulatory Visit: Payer: Self-pay

## 2020-12-12 ENCOUNTER — Ambulatory Visit: Payer: Self-pay | Attending: Internal Medicine

## 2021-01-07 ENCOUNTER — Other Ambulatory Visit: Payer: Self-pay

## 2021-01-07 ENCOUNTER — Ambulatory Visit: Payer: Self-pay | Attending: Internal Medicine

## 2021-01-25 ENCOUNTER — Other Ambulatory Visit: Payer: Self-pay

## 2021-01-25 MED FILL — Mirtazapine Tab 30 MG: ORAL | 30 days supply | Qty: 30 | Fill #0 | Status: AC

## 2021-01-30 ENCOUNTER — Other Ambulatory Visit: Payer: Self-pay

## 2021-02-04 ENCOUNTER — Encounter: Payer: Self-pay | Admitting: Internal Medicine

## 2021-02-04 ENCOUNTER — Other Ambulatory Visit: Payer: Self-pay

## 2021-02-04 ENCOUNTER — Ambulatory Visit: Payer: Self-pay | Attending: Internal Medicine | Admitting: Internal Medicine

## 2021-02-04 VITALS — BP 126/85 | HR 92 | Resp 16 | Wt 146.8 lb

## 2021-02-04 DIAGNOSIS — M79644 Pain in right finger(s): Secondary | ICD-10-CM

## 2021-02-04 DIAGNOSIS — F325 Major depressive disorder, single episode, in full remission: Secondary | ICD-10-CM

## 2021-02-04 DIAGNOSIS — E782 Mixed hyperlipidemia: Secondary | ICD-10-CM

## 2021-02-04 DIAGNOSIS — K069 Disorder of gingiva and edentulous alveolar ridge, unspecified: Secondary | ICD-10-CM

## 2021-02-04 DIAGNOSIS — F3170 Bipolar disorder, currently in remission, most recent episode unspecified: Secondary | ICD-10-CM

## 2021-02-04 DIAGNOSIS — Z23 Encounter for immunization: Secondary | ICD-10-CM

## 2021-02-04 NOTE — Progress Notes (Signed)
Patient ID: Loretta [name], female    DOB: 1961-04-12  MRN: [id number]  CC: Follow-up (4 month )   Subjective: Loretta [name] is a 60 y.o. female who presents for chronic ds Her concerns today include: Pt with hx of Bipolar disorder,reactive depression,arthritis, insomnia, HL.  She continues to use a weight loss supplement called Thrive which she told me about on last visit.  "The Thrive has made me feel so much better. - more energy, I sleep better."  Uses her Hulu hoop for 30 mins 5 days a wk. "My stomach has not bothered me."  Down 12 lbs since 05/2020 "Mental and physically I have not feel this well in a long time."  Lump RT thumb since February.  Hurts to bend it and open bottles  Request dental referral - gums hurt and pain lower RT tooth. +Cold sensitive  HM: plans to get COVID booster Patient Active Problem List   Diagnosis Date Noted  . Mixed hyperlipidemia 06/03/2020  . Seasonal allergies 07/15/2017  . History of hemorrhoidectomy 07/15/2017  . 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 Fatigue due to depression 10/07/2016  . Bipolar disorder (HCC) 06/20/2016  . GERD (gastroesophageal reflux disease) 07/24/2014     Current Outpatient Medications on File Prior to Visit  Medication Sig Dispense Refill  . cetirizine (ZYRTEC) 10 MG tablet TAKE 1 TABLET (10 MG TOTAL) BY MOUTH DAILY. 90 tablet 0  . diclofenac Sodium (VOLTAREN) 1 % GEL Apply 4 g topically 4 (four) times daily. 100 g 5  . fluticasone (FLONASE) 50 MCG/ACT nasal spray Place 2 sprays into both nostrils daily. 16 g 6  . mirtazapine (REMERON) 30 MG tablet TAKE 1 TABLET (30 MG TOTAL) BY MOUTH AT BEDTIME. 30 tablet 5   No current facility-administered medications on file prior to visit.    Allergies  Allergen Reactions  . Codeine Itching, Nausea Only and Other (See Comments)    Pt feels like skin is crawling  . Penicillins Itching, Nausea Only and Other (See Comments)    Pt feels like skin is crawling    Social  History   Socioeconomic History  . Marital status: Divorced    Spouse name: Not on file  . Number of children: 5  . Years of education: Not on file  . Highest education level: Not on file  Occupational History  . 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  Tobacco Use  . Smoking status: Former Smoker    Types: Cigarettes    Quit date: 02/05/2008    Years since quitting: 13.0  . Smokeless tobacco: Never Used  . Tobacco comment: Use marijuana  Vaping Use  . Vaping Use: Never used  Substance and Sexual Activity  . Alcohol use: Yes    Comment: Occassionally  . Drug use: Yes    Types: Marijuana    Comment: Every day for insomnia; relax to sleep  . Sexual activity: Yes    Birth control/protection: Surgical  Other Topics Concern  . Not on file  Social History Narrative  . Not on file   Social Determinants of Health   Financial Resource Strain: Not on file  Food Insecurity: Not on file  Transportation Needs: Not on file  Physical Activity: Not on file  Stress: Not on file  Social Connections: Not on file  Intimate Partner Violence: Not on file    Family History  Problem Relation Age of Onset  . Lung cancer Father   . Diabetes Father   .  Cancer Father   . Heart attack Father   . Heart attack Mother   . Cancer Paternal Uncle   . Cancer Paternal Uncle   . Colon cancer Neg Hx   . Colon polyps Neg Hx   . Kidney disease Neg Hx   . Breast cancer Neg Hx     Past Surgical History:  Procedure Laterality Date  . APPENDECTOMY    . HEMORROIDECTOMY  01/21/2017  . TUBAL LIGATION      ROS: Review of Systems Negative except as stated above  PHYSICAL EXAM: BP 126/85   Pulse 92   Resp 16   Wt 146 lb 12.8 oz (66.6 kg)   SpO2 99%   BMI 24.43 kg/m   Wt Readings from Last 3 Encounters:  02/04/21 146 lb 12.8 oz (66.6 kg)  10/09/20 150 lb (68 kg)  06/01/20 158 lb (71.7 kg)    Physical Exam  General appearance - alert, well appearing, and in no  distress Mental status - normal mood, behavior, speech, dress, motor activity, and thought processes Mouth - mucous membranes moist, pharynx normal without lesions.  She has moderate receding gum lines.  No erythema or inflammation noted of the gums. Chest - clear to auscultation, no wheezes, rales or rhonchi, symmetric air entry Heart - normal rate, regular rhythm, normal S1, S2, no murmurs, rubs, clicks or gallops Extremities - peripheral pulses normal, no pedal edema, no clubbing or cyanosis MSK: RT thumb -no edema or erythema noted of the thumb.  She has a soft movable cystic lesion at the base of the thumb palmar aspect.  It is slightly tender to touch. Depression screen [facility] 2/9 02/04/2021 10/09/2020 06/01/2020  Decreased Interest 0 0 2  Down, Depressed, Hopeless 0 3 2  PHQ - 2 Score 0 3 4  Altered sleeping - 0 3  Tired, decreased energy - 0 2  Change in appetite - 0 3  Feeling bad or failure about yourself  - 0 3  Trouble concentrating - 0 3  Moving slowly or fidgety/restless - 0 2  Suicidal thoughts - 0 2  PHQ-9 Score - 3 22  Some recent data might be hidden   GAD 7 : Generalized Anxiety Score 02/04/2021 10/09/2020 06/01/2020 01/31/2020  Nervous, Anxious, on Edge 0 0 1 0  Control/stop worrying 0 0 2 3  Worry too much - different things 0 0 2 3  Trouble relaxing 0 0 2 1  Restless 0 0 2 1  Easily annoyed or irritable 1 0 3 1  Afraid - awful might happen 0 0 1 0  Total GAD 7 Score 1 0 13 9      CMP Latest Ref Rng & Units 06/01/2020 06/02/2019 06/10/2018  Glucose 65 - 99 mg/dL 84 85 16(S)  BUN 8 - 27 mg/dL 16 18 06(T)  Creatinine 0.57 - 1.00 mg/dL 0.16(W) 1.09 3.23  Sodium 134 - 144 mmol/L 138 137 142  Potassium 3.5 - 5.2 mmol/L 4.7 4.8 4.7  Chloride 96 - 106 mmol/L 100 97 100  CO2 20 - 29 mmol/L 26 25 24   Calcium 8.7 - 10.3 mg/dL 9.4 9.9  Total Protein 6.0 - 8.5 g/dL 6.6 7.2 7.4  Total Bilirubin 0.0 - 1.2 mg/dL 0.2 0.3 0.2  Alkaline Phos 48 - 121 IU/L 49 72 53  AST 0 - 40 IU/L  19 19 24   ALT 0 - 32 IU/L 12 14 23    Lipid Panel     Component Value Date/Time  CHOL 212 (H) 06/01/2020 1424   TRIG 89 06/01/2020 1424   HDL 83 06/01/2020 1424   CHOLHDL 2.6 06/01/2020 1424   LDLCALC 113 (H) 06/01/2020 1424    CBC    Component Value Date/Time   WBC 7.5 06/01/2020 1424   WBC 7.0 10/07/2016 0932   RBC 4.32 06/01/2020 1424   RBC 4.87 10/07/2016 0932   HGB 13.0 06/01/2020 1424   HCT 38.8 06/01/2020 1424   PLT 223 06/01/2020 1424   MCV 90 06/01/2020 1424   MCH 30.1 06/01/2020 1424   MCH 28.1 10/07/2016 0932   MCHC 33.5 06/01/2020 1424   MCHC 32.8 10/07/2016 0932   RDW 12.8 06/01/2020 1424   LYMPHSABS 2,436 07/10/2016 1054   MONOABS 756 07/10/2016 1054   EOSABS 168 07/10/2016 1054   BASOSABS 0 07/10/2016 1054    ASSESSMENT AND PLAN: 1. Major depression in remission [facility]) Patient appears to be in remission and doing quite well.  PHQ-9 score today is 0 which is the lowest it has been in quite a while.  2. Bipolar disorder in full remission, most recent episode unspecified type (HCC) This seems to be stable at this time also.  3. Chronic gum disease - Ambulatory referral to Dentistry  4. Thumb pain, right - Ambulatory referral to Orthopedic Surgery  5. Mixed hyperlipidemia LDL when last check was not at goal.  We will recheck a lipid profile, CBC and comprehensive metabolic panel given the CAM med that she is taking to make sure it is not having any negative effect on her kidney or liver function. - CBC - Comprehensive metabolic panel - Lipid panel  6. COVID-19 vaccine series started Encouraged her to get the COVID-19 vaccine.  7.  Elevated blood pressure Systolic and diastolic blood pressure elevated today initially but improved on repeat.  Advised patient that she may need to keep an eye on her blood pressure checking it at least once a month with her being on this herbal medication that she is taking to make sure that it is not adversely affecting  her blood pressure.  DASH diet discussed and encouraged.   Patient was given the opportunity to ask questions.  Patient verbalized understanding of the plan and was able to repeat key elements of the plan.   Orders Placed This Encounter  Procedures  . CBC  . Comprehensive metabolic panel  . Lipid panel  . Ambulatory referral to Orthopedic Surgery  . Ambulatory referral to Dentistry     Requested Prescriptions    No prescriptions requested or ordered in this encounter    Return in about 4 months (around 06/07/2021).  Jonah Blue, MD, FACP

## 2021-02-05 LAB — CBC
Hematocrit: 43 % (ref 34.0–46.6)
Hemoglobin: 14.3 g/dL (ref 11.1–15.9)
MCH: 29.6 pg (ref 26.6–33.0)
MCHC: 33.3 g/dL (ref 31.5–35.7)
MCV: 89 fL (ref 79–97)
Platelets: 253 10*3/uL (ref 150–450)
RBC: 4.83 x10E6/uL (ref 3.77–5.28)
RDW: 12.7 % (ref 11.7–15.4)
WBC: 6.7 10*3/uL (ref 3.4–10.8)

## 2021-02-05 LAB — COMPREHENSIVE METABOLIC PANEL
ALT: 16 IU/L (ref 0–32)
AST: 13 IU/L (ref 0–40)
Albumin/Globulin Ratio: 1.9 (ref 1.2–2.2)
Albumin: 5 g/dL — ABNORMAL HIGH (ref 3.8–4.8)
Alkaline Phosphatase: 55 IU/L (ref 44–121)
BUN/Creatinine Ratio: 24 (ref 12–28)
BUN: 23 mg/dL (ref 8–27)
Bilirubin Total: 0.3 mg/dL (ref 0.0–1.2)
CO2: 26 mmol/L (ref 20–29)
Calcium: 10.3 mg/dL (ref 8.7–10.3)
Chloride: 101 mmol/L (ref 96–106)
Creatinine, Ser: 0.95 mg/dL (ref 0.57–1.00)
Globulin, Total: 2.7 g/dL (ref 1.5–4.5)
Glucose: 87 mg/dL (ref 65–99)
Potassium: 4.7 mmol/L (ref 3.5–5.2)
Sodium: 143 mmol/L (ref 134–144)
Total Protein: 7.7 g/dL (ref 6.0–8.5)
eGFR: 68 mL/min/{1.73_m2} (ref >59)

## 2021-02-05 LAB — LIPID PANEL
Chol/HDL Ratio: 2.4 ratio (ref 0.0–4.4)
Cholesterol, Total: 219 mg/dL — ABNORMAL HIGH (ref 100–199)
HDL: 91 mg/dL (ref >39)
LDL Chol Calc (NIH): 116 mg/dL — ABNORMAL HIGH (ref 0–99)
Triglycerides: 69 mg/dL (ref 0–149)
VLDL Cholesterol Cal: 12 mg/dL (ref 5–40)

## 2021-02-08 ENCOUNTER — Ambulatory Visit (INDEPENDENT_AMBULATORY_CARE_PROVIDER_SITE_OTHER): Payer: No Typology Code available for payment source | Admitting: Orthopaedic Surgery

## 2021-02-08 ENCOUNTER — Telehealth: Payer: Self-pay

## 2021-02-08 ENCOUNTER — Encounter: Payer: Self-pay | Admitting: Orthopaedic Surgery

## 2021-02-08 ENCOUNTER — Ambulatory Visit: Payer: Self-pay

## 2021-02-08 DIAGNOSIS — M1811 Unilateral primary osteoarthritis of first carpometacarpal joint, right hand: Secondary | ICD-10-CM

## 2021-02-08 MED ORDER — LIDOCAINE HCL 1 % IJ SOLN
0.3000 mL | INTRAMUSCULAR | Status: AC | PRN
  Administered 2021-02-08: .3 mL

## 2021-02-08 MED ORDER — BUPIVACAINE HCL 0.5 % IJ SOLN
0.3300 mL | INTRAMUSCULAR | Status: AC | PRN
  Administered 2021-02-08: .33 mL

## 2021-02-08 MED ORDER — METHYLPREDNISOLONE ACETATE 40 MG/ML IJ SUSP
13.3300 mg | INTRAMUSCULAR | Status: AC | PRN
  Administered 2021-02-08: 13.33 mg

## 2021-02-08 NOTE — Progress Notes (Signed)
Office Visit Note   Patient: Loretta [name]           Date of Birth: 06/20/1961           MRN: [id number] Visit Date: 02/08/2021              Requested by: Loretta Matar, MD [address] [place],  [place] [address] PCP: Loretta Matar, MD   Assessment & Plan: Visit Diagnoses:  1. Primary osteoarthritis of first carpometacarpal joint of right hand     Plan: Impression is advanced right thumb basal joint arthritis.  Options discussed to include cortisone injection, topical NSAIDs, orthotic for support versus surgical treatment as a more permanent solution.  Patient will go the nonsurgical route for now and seeing what kind of relief she gets.  She tolerated the injection well today.  We will see her back as needed.  Follow-Up Instructions: Return if symptoms worsen or fail to improve.   Orders:  Orders Placed This Encounter  Procedures  . Hand/UE Inj  . XR Finger Thumb Right   No orders of the defined types were placed in this encounter.     Procedures: Hand/UE Inj: R thumb CMC for osteoarthritis on 02/08/2021 12:58 PM Indications: pain Details: 25 G needle Medications: 0.3 mL lidocaine 1 %; 0.33 mL bupivacaine 0.5 %; 13.33 mg methylPREDNISolone acetate 40 MG/ML Outcome: tolerated well, no immediate complications      Clinical Data: No additional findings.   Subjective: Chief Complaint  Patient presents with  . Right Thumb - Pain    Loretta [name] is a 60 year old female here for evaluation of chronic right thumb pain for years.  She has noticed a knot at the bottom of her thumb that is painful with use of the hand.  She is also noticed some decreased range of motion and swelling.  Denies any numbness and tingling.  She cannot take oral NSAIDs due to chronic kidney disease.   Review of Systems  Constitutional: Negative.   HENT: Negative.   Eyes: Negative.   Respiratory: Negative.   Cardiovascular: Negative.   Endocrine: Negative.   Musculoskeletal:  Negative.   Neurological: Negative.   Hematological: Negative.   Psychiatric/Behavioral: Negative.   All other systems reviewed and are negative.    Objective: Vital Signs: There were no vitals taken for this visit.  Physical Exam Vitals and nursing note reviewed.  Constitutional:      Appearance: She is well-developed.  HENT:     Head: Normocephalic and atraumatic.  Pulmonary:     Effort: Pulmonary effort is normal.  Abdominal:     Palpations: Abdomen is soft.  Musculoskeletal:     Cervical back: Neck supple.  Skin:    General: Skin is warm.     Capillary Refill: Capillary refill takes less than 2 seconds.  Neurological:     Mental Status: She is alert and oriented to person, place, and time.  Psychiatric:        Behavior: Behavior normal.        Thought Content: Thought content normal.        Judgment: Judgment normal.     Ortho Exam Right hand shows a prominent thumb metacarpal.  There is limited range of motion of the CMC joint.  Slight compensation at the MCP joint with hyperextension.  There is pain and crepitus with grind test.  There is no triggering. Specialty Comments:  No specialty comments available.  Imaging: XR Finger Thumb Right  Result  Date: 02/08/2021 Advanced degenerative changes of the basal joint with radial subluxation of the thumb metacarpal    PMFS History: Patient Active Problem List   Diagnosis Date Noted  . Primary osteoarthritis of first carpometacarpal joint of right hand 02/08/2021  . Mixed hyperlipidemia 06/03/2020  . Seasonal allergies 07/15/2017  . History of hemorrhoidectomy 07/15/2017  . 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 Fatigue due to depression 10/07/2016  . Bipolar disorder (HCC) 06/20/2016  . GERD (gastroesophageal reflux disease) 07/24/2014   Past Medical History:  Diagnosis Date  . Bipolar affective (HCC) 1991  . Diverticulosis of colon without diverticulitis 09/2014  . Insomnia     Family History  Problem Relation  Age of Onset  . Lung cancer Father   . Diabetes Father   . Cancer Father   . Heart attack Father   . Heart attack Mother   . Cancer Paternal Uncle   . Cancer Paternal Uncle   . Colon cancer Neg Hx   . Colon polyps Neg Hx   . Kidney disease Neg Hx   . Breast cancer Neg Hx     Past Surgical History:  Procedure Laterality Date  . APPENDECTOMY    . HEMORROIDECTOMY  01/21/2017  . TUBAL LIGATION     Social History   Occupational History  . 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 Smoking status: Former Smoker    Types: Cigarettes    Quit date: 02/05/2008    Years since quitting: 13.0  . Smokeless tobacco: Never Used  . Tobacco comment: Use marijuana  Vaping Use  . Vaping Use: Never used  Substance and Sexual Activity  . Alcohol use: Yes    Comment: Occassionally  . Drug use: Yes    Types: Marijuana    Comment: Every day for insomnia; relax to sleep  . Sexual activity: Yes    Birth control/protection: Surgical

## 2021-02-08 NOTE — Telephone Encounter (Signed)
Contacted pt to go over lab results pt is aware and doesn't have any questions or concerns 

## 2021-02-25 ENCOUNTER — Other Ambulatory Visit: Payer: Self-pay

## 2021-02-25 MED FILL — Mirtazapine Tab 30 MG: ORAL | 30 days supply | Qty: 30 | Fill #1 | Status: AC

## 2021-03-01 ENCOUNTER — Other Ambulatory Visit: Payer: Self-pay

## 2021-03-25 ENCOUNTER — Other Ambulatory Visit: Payer: Self-pay

## 2021-03-25 MED FILL — Mirtazapine Tab 30 MG: ORAL | 30 days supply | Qty: 30 | Fill #2 | Status: AC

## 2021-03-27 ENCOUNTER — Other Ambulatory Visit: Payer: Self-pay

## 2021-04-11 ENCOUNTER — Other Ambulatory Visit (HOSPITAL_BASED_OUTPATIENT_CLINIC_OR_DEPARTMENT_OTHER): Payer: Self-pay

## 2021-04-23 ENCOUNTER — Other Ambulatory Visit: Payer: Self-pay | Admitting: Internal Medicine

## 2021-04-23 DIAGNOSIS — F329 Major depressive disorder, single episode, unspecified: Secondary | ICD-10-CM

## 2021-04-24 ENCOUNTER — Other Ambulatory Visit: Payer: Self-pay

## 2021-04-24 MED ORDER — MIRTAZAPINE 30 MG PO TABS
ORAL_TABLET | ORAL | 5 refills | Status: DC
  Filled 2021-04-24: qty 30, 30d supply, fill #0
  Filled 2021-05-27: qty 30, 30d supply, fill #1
  Filled 2021-06-24: qty 30, 30d supply, fill #2
  Filled 2021-07-24: qty 30, 30d supply, fill #3
  Filled 2021-08-22: qty 30, 30d supply, fill #4
  Filled 2021-09-20: qty 30, 30d supply, fill #5

## 2021-04-26 ENCOUNTER — Other Ambulatory Visit: Payer: Self-pay

## 2021-05-27 ENCOUNTER — Other Ambulatory Visit: Payer: Self-pay

## 2021-05-29 ENCOUNTER — Other Ambulatory Visit: Payer: Self-pay

## 2021-06-07 ENCOUNTER — Other Ambulatory Visit (HOSPITAL_COMMUNITY)
Admission: RE | Admit: 2021-06-07 | Discharge: 2021-06-07 | Disposition: A | Discharge status: Home / Self Care | Payer: Self-pay | Source: Ambulatory Visit | Attending: Internal Medicine | Admitting: Internal Medicine

## 2021-06-07 ENCOUNTER — Ambulatory Visit: Payer: Self-pay | Attending: Internal Medicine | Admitting: Internal Medicine

## 2021-06-07 ENCOUNTER — Encounter: Payer: Self-pay | Admitting: Internal Medicine

## 2021-06-07 ENCOUNTER — Other Ambulatory Visit: Payer: Self-pay

## 2021-06-07 VITALS — BP 128/85 | HR 74 | Resp 16 | Wt 145.4 lb

## 2021-06-07 DIAGNOSIS — R030 Elevated blood-pressure reading, without diagnosis of hypertension: Secondary | ICD-10-CM

## 2021-06-07 DIAGNOSIS — Z124 Encounter for screening for malignant neoplasm of cervix: Secondary | ICD-10-CM

## 2021-06-07 DIAGNOSIS — Z1231 Encounter for screening mammogram for malignant neoplasm of breast: Secondary | ICD-10-CM

## 2021-06-07 DIAGNOSIS — E782 Mixed hyperlipidemia: Secondary | ICD-10-CM

## 2021-06-07 DIAGNOSIS — F329 Major depressive disorder, single episode, unspecified: Secondary | ICD-10-CM

## 2021-06-07 DIAGNOSIS — Z2821 Immunization not carried out because of patient refusal: Secondary | ICD-10-CM

## 2021-06-07 NOTE — Patient Instructions (Addendum)
Your blood pressure is elevated. Continue to limit salt in the foods.   Please call [phone] to schedule mammogram

## 2021-06-07 NOTE — Progress Notes (Signed)
Patient ID: Loretta [name], female    DOB: 1961-03-21  MRN: [id number]  CC: Chronic disease management and Pap smear  Subjective: Loretta [name] is a 60 y.o. female who presents for chronic ds management and pap Her concerns today include:  Pt with hx of Bipolar disorder, reactive depression, arthritis, insomnia, HL.   Wgh stable since last visit.  She is using a weighted hula hoop daily for 1 hour for exercise. LDL was slightly elev on last visit.  Does well with eating habits.    Depression/Bipolar: doing okay but some financial stress. Has 3 dogs one of which is having some health issues causing added expenses for her. Plans to give up Thrive, a supplement that she has been using because of limited finances. Feels the supplement really helps her mentally  BP still elevated today; elevated on last visit.  She limits salt in the foods.  GYN History:  Pt is G5P5 Any hx of abn paps?: no Menses regular or irregular?: NA How long does menses last? NA Menstrual flow light or heavy?: NA Method of birth control?: NA Any vaginal dischg at this time?: no Dysuria?: no Any hx of STI?: no Sexually active with how many partners: 1 Desires STI screen: no Last MMG: 04/2020 Family hx of uterine, cervical or breast cancer?:  no    Patient Active Problem List   Diagnosis Date Noted   Primary osteoarthritis of first carpometacarpal joint of right hand 02/08/2021   Mixed hyperlipidemia 06/03/2020   Seasonal allergies 07/15/2017   History of hemorrhoidectomy 07/15/2017   Hand arthritis 03/17/2017   Fatigue due to depression 10/07/2016   Bipolar disorder (HCC) 06/20/2016   GERD (gastroesophageal reflux disease) 07/24/2014     Current Outpatient Medications on File Prior to Visit  Medication Sig Dispense Refill   diclofenac Sodium (VOLTAREN) 1 % GEL Apply 4 g topically 4 (four) times daily. 100 g 5   mirtazapine (REMERON) 30 MG tablet TAKE 1 TABLET (30 MG TOTAL) BY MOUTH AT BEDTIME. 30  tablet 5   cetirizine (ZYRTEC) 10 MG tablet TAKE 1 TABLET (10 MG TOTAL) BY MOUTH DAILY. (Patient not taking: Reported on 06/07/2021) 90 tablet 0   fluticasone (FLONASE) 50 MCG/ACT nasal spray Place 2 sprays into both nostrils daily. (Patient not taking: Reported on 06/07/2021) 16 g 6   No current facility-administered medications on file prior to visit.    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  Tobacco Use   Smoking status: Former    Types: Cigarettes    Quit date: 02/05/2008    Years since quitting: 13.3   Smokeless tobacco: Never   Tobacco comments:    Use marijuana  Vaping Use   Vaping Use: Never used  Substance and Sexual Activity   Alcohol use: Yes    Comment: Occassionally   Drug use: Yes    Types: Marijuana    Comment: Every day for insomnia; relax to sleep  Sexual activity: Yes    Birth control/protection: Surgical  Other Topics Concern   Not on file  Social History Narrative   Not on file   Social Determinants of Health   Financial Resource Strain: Not on file  Food Insecurity: Not on file  Transportation Needs: Not on file  Physical Activity: Not on file  Stress: Not on file  Social Connections: Not on file  Intimate Partner Violence: Not on file    Family History  Problem Relation Age of Onset   Lung cancer Father    Diabetes Father    Cancer Father    Heart attack Father    Heart attack Mother    Cancer Paternal Uncle    Cancer Paternal Uncle    Colon cancer Neg Hx    Colon polyps Neg Hx    Kidney  disease Neg Hx    Breast cancer Neg Hx     Past Surgical History:  Procedure Laterality Date   APPENDECTOMY     HEMORROIDECTOMY  01/21/2017   TUBAL LIGATION      ROS: Review of Systems Negative except as stated above  PHYSICAL EXAM: BP 128/85   Pulse 74   Resp 16   Wt 145 lb 6.4 oz (66 kg)   SpO2 97%   BMI 24.20 kg/m   Wt Readings from Last 3 Encounters:  06/07/21 145 lb 6.4 oz (66 kg)  02/04/21 146 lb 12.8 oz (66.6 kg)  10/09/20 150 lb (68 kg)    Physical Exam  General appearance - alert, well appearing, and in no distress Mental status - normal mood, behavior, speech, dress, motor activity, and thought processes Chest - clear to auscultation, no wheezes, rales or rhonchi, symmetric air entry Heart - normal rate, regular rhythm, normal S1, S2, no murmurs, rubs, clicks or gallops Abdomen - soft, nontender, nondistended, no masses or organomegaly Breasts - breasts appear normal, no suspicious masses, no skin or nipple changes or axillary nodes Pelvic - normal external genitalia, vulva, vagina, cervix, uterus and adnexa Extremities - peripheral pulses normal, no pedal edema, no clubbing or cyanosis Vertis Kelch, CMA was present with me as chaperone during the breast and pelvic exam. Depression screen [facility] 2/9 06/07/2021 02/04/2021 10/09/2020  Decreased Interest - 0 0  Down, Depressed, Hopeless 1 0 3  PHQ - 2 Score 1 0 3  Altered sleeping - - 0  Tired, decreased energy - - 0  Change in appetite - - 0  Feeling bad or failure about yourself  - - 0  Trouble concentrating - - 0  Moving slowly or fidgety/restless - - 0  Suicidal thoughts - - 0  PHQ-9 Score - - 3  Some recent data might be hidden    CMP Latest Ref Rng & Units 02/04/2021 06/01/2020 06/02/2019  Glucose 65 - 99 mg/dL 87 84 85  BUN 8 - 27 mg/dL 23 16 18   Creatinine 0.57 - 1.00 mg/dL 9.16) 3.84(Y  Sodium 134 - 144 mmol/L 143 138 137  Potassium 3.5 - 5.2 mmol/L 4.7 4.7 4.8  Chloride 96 - 106 mmol/L 101 100 97   CO2 20 - 29 mmol/L 26 26 25   Calcium 8.7 - 10.3 mg/dL 6.59 9.4  Total Protein 6.0 - 8.5 g/dL 7.7 6.6 7.2  Total Bilirubin 0.0 - 1.2 mg/dL 0.3 0.2 0.3  Alkaline Phos 44 - 121 IU/L 55 49 72  AST 0 - 40 IU/L 13 19 19   ALT 0 - 32 IU/L 16 12  14   Lipid Panel     Component Value Date/Time   CHOL 219 (H) 02/04/2021 1022   TRIG 69 02/04/2021 1022   HDL 91 02/04/2021 1022   CHOLHDL 2.4 02/04/2021 1022   LDLCALC 116 (H) 02/04/2021 1022    CBC    Component Value Date/Time   WBC 6.7 02/04/2021 1022   WBC 7.0 10/07/2016 0932   RBC 4.83 02/04/2021 1022   RBC 4.87 10/07/2016 0932   HGB 14.3 02/04/2021 1022   HCT 43.0 02/04/2021 1022   PLT 253 02/04/2021 1022   MCV 89 02/04/2021 1022   MCH 29.6 02/04/2021 1022   MCH 28.1 10/07/2016 0932   MCHC 33.3 02/04/2021 1022   MCHC 32.8 10/07/2016 0932   RDW 12.7 02/04/2021 1022   LYMPHSABS 2,436 07/10/2016 1054   MONOABS 756 07/10/2016 1054   EOSABS 168 07/10/2016 1054   BASOSABS 0 07/10/2016 1054    ASSESSMENT AND PLAN: 1. Mixed hyperlipidemia Encouraged her to continue healthy eating habits and regular exercise.  2. Major depression, chronic Stable.  Continue Remeron  3. Elevated blood-pressure reading without diagnosis of hypertension Repeat blood pressure today a little better but diastolic pressure still elevated.  Continue DASH diet.  Follow-up with clinical pharmacist in 1 month for repeat blood pressure check.  If still elevated we will start her on low-dose of amlodipine.  4. Encounter for screening mammogram for malignant neoplasm of breast - MM Digital Screening; Future  5. Pap smear for cervical cancer screening - Cytology - PAP  6. Influenza vaccination declined by patient   Patient was given the opportunity to ask questions.  Patient verbalized understanding of the plan and was able to repeat key elements of the plan.   Orders Placed This Encounter  Procedures   MM Digital Screening     Requested  Prescriptions    No prescriptions requested or ordered in this encounter    Return in about 4 months (around 10/07/2021) for Give appt with [facility] in 1 mth for recheck BP.  Jonah Blue, MD, FACP

## 2021-06-13 LAB — CYTOLOGY - PAP
Comment: NEGATIVE
Diagnosis: NEGATIVE
High risk HPV: NEGATIVE

## 2021-06-18 ENCOUNTER — Telehealth: Payer: Self-pay

## 2021-06-18 ENCOUNTER — Telehealth: Payer: Self-pay | Admitting: *Deleted

## 2021-06-18 NOTE — Telephone Encounter (Signed)
Contacted pt to go over lab results pt didn't answer lvm asking pt to give me a call at her/his earliest convenience   Sent a CRM and forward labs to NT to give pt labs when they call back   

## 2021-06-18 NOTE — Telephone Encounter (Signed)
Pt returned call. Result note read to pt, verbalizes understanding.  

## 2021-06-24 ENCOUNTER — Other Ambulatory Visit: Payer: Self-pay

## 2021-06-28 ENCOUNTER — Other Ambulatory Visit: Payer: Self-pay

## 2021-07-08 ENCOUNTER — Encounter: Payer: Self-pay | Admitting: Pharmacist

## 2021-07-08 ENCOUNTER — Ambulatory Visit: Payer: Self-pay | Attending: Internal Medicine | Admitting: Pharmacist

## 2021-07-08 ENCOUNTER — Other Ambulatory Visit: Payer: Self-pay

## 2021-07-08 VITALS — BP 118/75

## 2021-07-08 DIAGNOSIS — R030 Elevated blood-pressure reading, without diagnosis of hypertension: Secondary | ICD-10-CM

## 2021-07-08 NOTE — Progress Notes (Signed)
   S:    PCP: Dr. Laural Benes  Patient arrives in good spirits. Presents to the clinic for hypertension evaluation, counseling, and management. Patient was referred and last seen by Primary Care Provider on 06/07/2021. BP was elevated at that visit but pt does not have dx of HTN. She was instructed to return in 1 month for recheck.    Medication adherence: currently not taking antihypertensive medications.   Current BP Medications include:  none   Antihypertensives tried in the past include: none  Dietary habits include: uses pink Himalayan salt. Denies eating processed or fast foods. Tries to purchase products that are "salt free". Exercise habits include: reports using a weighted hula hoop for 5-7x/week (1 hour at a time). Family / Social history:  -Fhx: MI, DM -Tobacco: former smoker (quit in 2009). Of note, smokes marijuana daily for relaxation/insomnia -Alcohol: reports occasional use.    O:  Vitals:   07/08/21 0851  BP: 118/75   Home BP readings: none  Last 3 Office BP readings: BP Readings from Last 3 Encounters:  07/08/21 118/75  06/07/21 128/85  02/04/21 126/85    BMET    Component Value Date/Time   NA 143 02/04/2021 1022   K 4.7 02/04/2021 1022   CL 101 02/04/2021 1022   CO2 26 02/04/2021 1022   GLUCOSE 87 02/04/2021 1022   GLUCOSE 93 07/23/2016 1041   BUN 23 02/04/2021 1022   CREATININE 0.95 02/04/2021 1022   CREATININE 0.73 07/23/2016 1041   CALCIUM 10.3 02/04/2021 1022   GFRNONAA 58 (L) 06/01/2020 1424   GFRNONAA >89 07/23/2016 1041   GFRAA 67 06/01/2020 1424   GFRAA >89 07/23/2016 1041    Renal function: CrCl cannot be calculated (Patient's most recent lab result is older than the maximum 21 days allowed.).  Clinical ASCVD: No  The 10-year ASCVD risk score (Arnett DK, et al., 2019) is: 2.4%   Values used to calculate the score:     Age: 60 years     Sex: Female     Is Non-Hispanic African American: No     Diabetic: No     Tobacco smoker: No      Systolic Blood Pressure: 118 mmHg     Is BP treated: No     HDL Cholesterol: 91 mg/dL     Total Cholesterol: 219 mg/dL  A/P: Hypertension undiagnosed. Today's BP is normotensive. BP in clinic in the past has looked good. She is not on medications currently and has not taken any in the past for BP. No changes today.  - No changes today.    -F/u labs ordered - none -Counseled on lifestyle modifications for blood pressure control including reduced dietary sodium, increased exercise, adequate sleep. -Return in 4-6 weeks for recheck. Encouraged pt to return with home values.   Results reviewed and written information provided.   Total time in face-to-face counseling 15 minutes.   F/U Clinic Visit in 4-6 weeks.    Patient seen with:  Johnston Ebbs, PharmD Candidate  [facility] SOP  Class of 2023  Butch Penny, PharmD, [place], CPP Clinical Pharmacist [facility] [phone]

## 2021-07-24 ENCOUNTER — Other Ambulatory Visit: Payer: Self-pay

## 2021-07-26 ENCOUNTER — Other Ambulatory Visit: Payer: Self-pay

## 2021-08-08 ENCOUNTER — Ambulatory Visit: Payer: Self-pay | Admitting: Pharmacist

## 2021-08-22 ENCOUNTER — Other Ambulatory Visit: Payer: Self-pay

## 2021-08-27 ENCOUNTER — Other Ambulatory Visit: Payer: Self-pay

## 2021-09-20 ENCOUNTER — Other Ambulatory Visit: Payer: Self-pay

## 2021-09-23 ENCOUNTER — Other Ambulatory Visit: Payer: Self-pay

## 2021-10-09 ENCOUNTER — Ambulatory Visit: Payer: Self-pay | Attending: Internal Medicine

## 2021-10-09 ENCOUNTER — Other Ambulatory Visit: Payer: Self-pay

## 2021-10-10 ENCOUNTER — Encounter: Payer: Self-pay | Admitting: Internal Medicine

## 2021-10-10 ENCOUNTER — Other Ambulatory Visit (HOSPITAL_COMMUNITY): Payer: Self-pay

## 2021-10-10 ENCOUNTER — Ambulatory Visit: Payer: Self-pay | Attending: Internal Medicine | Admitting: Internal Medicine

## 2021-10-10 VITALS — BP 114/80 | HR 70 | Resp 16 | Ht 65.0 in | Wt 146.0 lb

## 2021-10-10 DIAGNOSIS — Z28311 Partially vaccinated for covid-19: Secondary | ICD-10-CM

## 2021-10-10 DIAGNOSIS — Z289 Immunization not carried out for unspecified reason: Secondary | ICD-10-CM

## 2021-10-10 DIAGNOSIS — F329 Major depressive disorder, single episode, unspecified: Secondary | ICD-10-CM

## 2021-10-10 DIAGNOSIS — Z8659 Personal history of other mental and behavioral disorders: Secondary | ICD-10-CM

## 2021-10-10 MED ORDER — MIRTAZAPINE 30 MG PO TABS
30.0000 mg | ORAL_TABLET | Freq: Every day | ORAL | 5 refills | Status: DC
  Filled 2021-10-10: qty 30, fill #0
  Filled 2021-10-24: qty 30, 30d supply, fill #0
  Filled 2021-11-24: qty 30, 30d supply, fill #1
  Filled 2021-12-24: qty 30, 30d supply, fill #0
  Filled 2022-01-21: qty 30, 30d supply, fill #1
  Filled 2022-02-19: qty 30, 30d supply, fill #2
  Filled 2022-03-19: qty 30, 30d supply, fill #3

## 2021-10-10 NOTE — Progress Notes (Signed)
F/u HTN Shoulder and back discomfort

## 2021-10-10 NOTE — Progress Notes (Signed)
Patient ID: Loretta [name], female    DOB: 07-02-1961  MRN: [id number]  CC: chronic ds management  Subjective: Loretta [name] is a 61 y.o. female who presents for chronic ds management Her concerns today include:  Pt with hx of Bipolar disorder, reactive depression, arthritis, insomnia, HL.   We were concern with BP on last. Pt came back to see clinical pharmacist since then and BP was good.  BP today is good.  Wgh stable. Still exercising regularly with hula hoop.  Eating as healthy as she can  Dep/Bipolar:  Reports holidays always bad for her since daughter died several yrs ago.  Today would have been her mother's 80th BD.  Needs RF on Remeron.  Doing well on that.  Has 3 dogs who brings her joy.  Supportive BF.  HM:  declines Shingrix vaccine.  Declines COVID booster.    Patient Active Problem List   Diagnosis Date Noted   Primary osteoarthritis of first carpometacarpal joint of right hand 02/08/2021   Mixed hyperlipidemia 06/03/2020   Seasonal allergies 07/15/2017   History of hemorrhoidectomy 07/15/2017   Hand arthritis 03/17/2017   Fatigue due to depression 10/07/2016   Bipolar disorder ([place]) 06/20/2016   GERD (gastroesophageal reflux disease) 07/24/2014     Current Outpatient Medications on File Prior to Visit  Medication Sig Dispense Refill   diclofenac Sodium (VOLTAREN) 1 % GEL Apply 4 g topically 4 (four) times daily. 100 g 5   fluticasone (FLONASE) 50 MCG/ACT nasal spray Place 2 sprays into both nostrils daily. 16 g 6   cetirizine (ZYRTEC) 10 MG tablet TAKE 1 TABLET (10 MG TOTAL) BY MOUTH DAILY. (Patient not taking: Reported on 06/07/2021) 90 tablet 0   No current facility-administered medications on file prior to visit.    Allergies  Allergen Reactions   Codeine Itching, Nausea Only and Other (See Comments)    Pt feels like skin is crawling   Penicillins Itching, Nausea Only and Other (See Comments)    Pt feels like skin is crawling    Social History    Socioeconomic History   Marital status: Widowed    Spouse name: Not on file   Number of children: 5   Years of education: Not on file   Highest education level: Not on file  Occupational History   Occupation: Public house manager: [employer]  Tobacco Use   Smoking status: Former    Types: Cigarettes    Quit date: 02/05/2008    Years since quitting: 13.6   Smokeless tobacco: Never   Tobacco comments:    Use marijuana  Vaping Use   Vaping Use: Never used  Substance and Sexual Activity   Alcohol use: Yes    Comment: Occassionally   Drug use: Yes    Types: Marijuana    Comment: Every day for insomnia; relax to sleep   Sexual activity: Yes    Birth control/protection: Surgical  Other Topics Concern   Not on file  Social History Narrative   Not on file   Social Determinants of Health   Financial Resource Strain: Not on file  Food Insecurity: Not on file  Transportation Needs: Not on file  Physical Activity: Not on file  Stress: Not on file  Social Connections: Not on file  Intimate Partner Violence: Not on file    Family History  Problem Relation Age of Onset   Lung cancer Father    Diabetes Father    Cancer Father  Heart attack Father    Heart attack Mother    Cancer Paternal Uncle    Cancer Paternal Uncle    Colon cancer Neg Hx    Colon polyps Neg Hx    Kidney disease Neg Hx    Breast cancer Neg Hx     Past Surgical History:  Procedure Laterality Date   APPENDECTOMY     HEMORROIDECTOMY  01/21/2017   TUBAL LIGATION      ROS: Review of Systems Negative except as stated above  PHYSICAL EXAM: BP 114/80 (BP Location: Left Arm, Patient Position: Sitting, Cuff Size: Normal)    Pulse 70    Resp 16    Ht 5\' 5"  (1.651 m)    Wt 146 lb (66.2 kg)    SpO2 98%    BMI 24.30 kg/m   Wt Readings from Last 3 Encounters:  10/10/21 146 lb (66.2 kg)  06/07/21 145 lb 6.4 oz (66 kg)  02/04/21 146 lb 12.8 oz (66.6 kg)    Physical Exam  General appearance -  alert, well appearing, and in no distress Mental status - normal mood, behavior, speech, dress, motor activity, and thought processes Neck - supple, no significant adenopathy Chest - clear to auscultation, no wheezes, rales or rhonchi, symmetric air entry Heart - normal rate, regular rhythm, normal S1, S2, no murmurs, rubs, clicks or gallops Extremities - peripheral pulses normal, no pedal edema, no clubbing or cyanosis  Depression screen  [facility] 2/9 10/10/2021 06/07/2021 02/04/2021  Decreased Interest 1 - 0  Down, Depressed, Hopeless 1 1 0  PHQ - 2 Score 2 1 0  Altered sleeping 1 - -  Tired, decreased energy 1 - -  Change in appetite 1 - -  Feeling bad or failure about yourself  1 - -  Trouble concentrating 0 - -  Moving slowly or fidgety/restless 0 - -  Suicidal thoughts - - -  PHQ-9 Score 6 - -  Some recent data might be hidden    CMP Latest Ref Rng & Units 02/04/2021 06/01/2020 06/02/2019  Glucose 65 - 99 mg/dL 87 84 85  BUN 8 - 27 mg/dL 23 16 18   Creatinine 0.57 - 1.00 mg/dL 0.95 1.05(H) 0.81  Sodium 134 - 144 mmol/L 143 138 137  Potassium 3.5 - 5.2 mmol/L 4.7 4.7 4.8  Chloride 96 - 106 mmol/L 101 100 97  CO2 20 - 29 mmol/L 26 26 25   Calcium 8.7 - 10.3 mg/dL 10.3 9.4 10.0  Total Protein 6.0 - 8.5 g/dL 7.7 6.6 7.2  Total Bilirubin 0.0 - 1.2 mg/dL 0.3 0.2 0.3  Alkaline Phos 44 - 121 IU/L 55 49 72  AST 0 - 40 IU/L 13 19 19   ALT 0 - 32 IU/L 16 12 14    Lipid Panel     Component Value Date/Time   CHOL 219 (H) 02/04/2021 1022   TRIG 69 02/04/2021 1022   HDL 91 02/04/2021 1022   CHOLHDL 2.4 02/04/2021 1022   LDLCALC 116 (H) 02/04/2021 1022    CBC    Component Value Date/Time   WBC 6.7 02/04/2021 1022   WBC 7.0 10/07/2016 0932   RBC 4.83 02/04/2021 1022   RBC 4.87 10/07/2016 0932   HGB 14.3 02/04/2021 1022   HCT 43.0 02/04/2021 1022   PLT 253 02/04/2021 1022   MCV 89 02/04/2021 1022   MCH 29.6 02/04/2021 1022   MCH 28.1 10/07/2016 0932   MCHC 33.3 02/04/2021 1022   MCHC  32.8 10/07/2016 0932   RDW  12.7 02/04/2021 1022   LYMPHSABS 2,436 07/10/2016 1054   MONOABS 756 07/10/2016 1054   EOSABS 168 07/10/2016 1054   BASOSABS 0 07/10/2016 1054    ASSESSMENT AND PLAN:  1. Major depression, chronic Declines counseling or BH referral - mirtazapine (REMERON) 30 MG tablet; TAKE 1 TABLET (30 MG TOTAL) BY MOUTH AT BEDTIME.  Dispense: 30 tablet; Refill: 5  2. History of bipolar disorder See #1 above.  3. COVID-19 vaccine series not completed Recommend that she completes the vaccine series by getting the most recent booster.  Patient declines at this time.  Patient was given the opportunity to ask questions.  Patient verbalized understanding of the plan and was able to repeat key elements of the plan.   No orders of the defined types were placed in this encounter.    Requested Prescriptions   Signed Prescriptions Disp Refills   mirtazapine (REMERON) 30 MG tablet 30 tablet 5    Sig: Take 1 tablet (30 mg total) by mouth at bedtime.    Return in about 4 months (around 02/07/2022).  Karle Plumber, MD, FACP

## 2021-10-23 ENCOUNTER — Other Ambulatory Visit: Payer: Self-pay

## 2021-10-24 ENCOUNTER — Other Ambulatory Visit: Payer: Self-pay

## 2021-10-25 ENCOUNTER — Other Ambulatory Visit: Payer: Self-pay

## 2021-11-25 ENCOUNTER — Other Ambulatory Visit (HOSPITAL_COMMUNITY): Payer: Self-pay

## 2021-11-26 ENCOUNTER — Other Ambulatory Visit: Payer: Self-pay

## 2021-11-26 ENCOUNTER — Other Ambulatory Visit (HOSPITAL_COMMUNITY): Payer: Self-pay

## 2021-12-24 ENCOUNTER — Other Ambulatory Visit: Payer: Self-pay

## 2022-01-22 ENCOUNTER — Other Ambulatory Visit: Payer: Self-pay

## 2022-02-07 ENCOUNTER — Ambulatory Visit: Payer: Self-pay | Admitting: Internal Medicine

## 2022-02-19 ENCOUNTER — Other Ambulatory Visit: Payer: Self-pay

## 2022-02-20 ENCOUNTER — Other Ambulatory Visit: Payer: Self-pay

## 2022-03-19 ENCOUNTER — Other Ambulatory Visit: Payer: Self-pay

## 2022-03-21 ENCOUNTER — Other Ambulatory Visit: Payer: Self-pay

## 2022-04-14 ENCOUNTER — Ambulatory Visit: Payer: Self-pay | Attending: Internal Medicine | Admitting: Critical Care Medicine

## 2022-04-14 ENCOUNTER — Other Ambulatory Visit: Payer: Self-pay

## 2022-04-14 ENCOUNTER — Encounter: Payer: Self-pay | Admitting: Critical Care Medicine

## 2022-04-14 VITALS — BP 109/72 | HR 80 | Temp 98.4°F | Ht 65.0 in | Wt 153.8 lb

## 2022-04-14 DIAGNOSIS — F329 Major depressive disorder, single episode, unspecified: Secondary | ICD-10-CM

## 2022-04-14 DIAGNOSIS — R5383 Other fatigue: Secondary | ICD-10-CM

## 2022-04-14 DIAGNOSIS — M19049 Primary osteoarthritis, unspecified hand: Secondary | ICD-10-CM

## 2022-04-14 DIAGNOSIS — F3170 Bipolar disorder, currently in remission, most recent episode unspecified: Secondary | ICD-10-CM

## 2022-04-14 DIAGNOSIS — F32A Depression, unspecified: Secondary | ICD-10-CM

## 2022-04-14 DIAGNOSIS — Z139 Encounter for screening, unspecified: Secondary | ICD-10-CM

## 2022-04-14 DIAGNOSIS — E782 Mixed hyperlipidemia: Secondary | ICD-10-CM

## 2022-04-14 MED ORDER — DICLOFENAC SODIUM 1 % EX GEL
4.0000 g | Freq: Four times a day (QID) | CUTANEOUS | 5 refills | Status: DC
  Filled 2022-04-14: qty 100, fill #0
  Filled 2022-05-16: qty 100, 6d supply, fill #0
  Filled 2022-06-14: qty 100, 7d supply, fill #0
  Filled 2022-07-18: qty 100, 7d supply, fill #1
  Filled 2022-08-14: qty 100, 7d supply, fill #2
  Filled 2022-09-12 – 2022-11-11 (×4): qty 100, 7d supply, fill #3
  Filled 2022-12-09: qty 100, 7d supply, fill #4
  Filled 2023-01-07: qty 100, 7d supply, fill #5

## 2022-04-14 MED ORDER — MIRTAZAPINE 30 MG PO TABS
30.0000 mg | ORAL_TABLET | Freq: Every day | ORAL | 5 refills | Status: DC
  Filled 2022-04-14 (×2): qty 30, 30d supply, fill #0
  Filled 2022-05-19: qty 30, 30d supply, fill #1
  Filled 2022-06-14: qty 30, 30d supply, fill #2
  Filled 2022-07-18: qty 30, 30d supply, fill #3
  Filled 2022-08-14: qty 30, 30d supply, fill #4
  Filled 2022-09-12: qty 30, 30d supply, fill #5

## 2022-04-14 NOTE — Assessment & Plan Note (Signed)
We will plan to follow-up lipid panel

## 2022-04-14 NOTE — Patient Instructions (Signed)
Return for fasting labs  Refills on voltaren gel and remeron sent to pharmacy   Return Dr Laural Benes in 5 months

## 2022-04-14 NOTE — Assessment & Plan Note (Signed)
Tolerating Remeron well

## 2022-04-14 NOTE — Assessment & Plan Note (Signed)
Improved

## 2022-04-14 NOTE — Progress Notes (Signed)
Established Patient Office Visit  Subjective   Patient ID: Loretta [name], female    DOB: 05-13-1961  Age: 61 y.o. MRN: [id number]  Chief Complaint  Patient presents with   Depression    61 y.o.F PCP pt of Laural Benes  Here for depression f/u   61 y.o.Patient last seen in December needs follow-up primary care labs  Patient's depression is improved and the Remeron has helped her sleep.  She has reactive depression because she lost her daughter in June and has had problems with depression ever since this was 5 years ago.  She will gets about 6 hours of sleep at night she does have 10 cats and 3 dogs in her home.  She does not drink alcohol.  She does not smoke.  She does not drink any caffeine.      Review of Systems  Constitutional:  Negative for chills, diaphoresis, fever, malaise/fatigue and weight loss.  HENT:  Negative for congestion, ear discharge, ear pain, hearing loss, nosebleeds, sore throat and tinnitus.   Eyes:  Negative for blurred vision, double vision, photophobia and discharge.  Respiratory:  Negative for cough, hemoptysis, sputum production, shortness of breath, wheezing and stridor.        No excess mucus  Cardiovascular:  Negative for chest pain, palpitations, orthopnea, claudication, leg swelling and PND.  Gastrointestinal:  Negative for abdominal pain, blood in stool, constipation, diarrhea, heartburn, melena, nausea and vomiting.  Genitourinary:  Negative for dysuria, flank pain, frequency, hematuria and urgency.  Musculoskeletal:  Negative for back pain, falls, joint pain, myalgias and neck pain.  Skin:  Negative for itching and rash.  Neurological:  Negative for dizziness, tingling, tremors, sensory change, speech change, focal weakness, seizures, loss of consciousness, weakness and headaches.  Endo/Heme/Allergies:  Negative for environmental allergies and polydipsia. Does not bruise/bleed easily.  Psychiatric/Behavioral:  Negative for depression, hallucinations,  memory loss, substance abuse and suicidal ideas. The patient is not nervous/anxious and does not have insomnia.   All other systems reviewed and are negative.     Objective:     BP 109/72   Pulse 80   Temp 98.4 F (36.9 C) (Oral)   Ht 5\' 5"  (1.651 m)   Wt 153 lb 12.8 oz (69.8 kg)   SpO2 95%   BMI 25.59 kg/m    Physical Exam Vitals reviewed.  Constitutional:      Appearance: Normal appearance. She is well-developed. She is not diaphoretic.  HENT:     Head: Normocephalic and atraumatic.     Nose: No nasal deformity, septal deviation, mucosal edema or rhinorrhea.     Right Sinus: No maxillary sinus tenderness or frontal sinus tenderness.     Left Sinus: No maxillary sinus tenderness or frontal sinus tenderness.     Mouth/Throat:     Pharynx: No oropharyngeal exudate.  Eyes:     General: No scleral icterus.    Conjunctiva/sclera: Conjunctivae normal.     Pupils: Pupils are equal, round, and reactive to light.  Neck:     Thyroid: No thyromegaly.     Vascular: No carotid bruit or JVD.     Trachea: Trachea normal. No tracheal tenderness or tracheal deviation.  Cardiovascular:     Rate and Rhythm: Normal rate and regular rhythm.     Chest Wall: PMI is not displaced.     Pulses: Normal pulses. No decreased pulses.     Heart sounds: Normal heart sounds, S1 normal and S2 normal. Heart sounds not distant. No murmur heard.  No systolic murmur is present.     No diastolic murmur is present.     No friction rub. No gallop. No S3 or S4 sounds.  Pulmonary:     Effort: No tachypnea, accessory muscle usage or respiratory distress.     Breath sounds: No stridor. No decreased breath sounds, wheezing, rhonchi or rales.  Chest:     Chest wall: No tenderness.  Abdominal:     General: Bowel sounds are normal. There is no distension.     Palpations: Abdomen is soft. Abdomen is not rigid.     Tenderness: There is no abdominal tenderness. There is no guarding or rebound.   Musculoskeletal:        General: Normal range of motion.     Cervical back: Normal range of motion and neck supple. No edema, erythema or rigidity. No muscular tenderness. Normal range of motion.  Lymphadenopathy:     Head:     Right side of head: No submental or submandibular adenopathy.     Left side of head: No submental or submandibular adenopathy.     Cervical: No cervical adenopathy.  Skin:    General: Skin is warm and dry.     Coloration: Skin is not pale.     Findings: No rash.     Nails: There is no clubbing.  Neurological:     Mental Status: She is alert and oriented to person, place, and time.     Sensory: No sensory deficit.  Psychiatric:        Speech: Speech normal.        Behavior: Behavior normal.      No results found for any visits on 04/14/22.    The 10-year ASCVD risk score (Arnett DK, et al., 2019) is: 2.4%    Assessment & Plan:   Problem List Items Addressed This Visit       Musculoskeletal and Integument   RESOLVED: Hand arthritis   Relevant Medications   diclofenac Sodium (VOLTAREN) 1 % GEL     Other   Bipolar disorder (HCC) (Chronic)    Tolerating Remeron well      Fatigue due to depression    Improved      Mixed hyperlipidemia    We will plan to follow-up lipid panel      Relevant Orders   Lipid panel   Other Visit Diagnoses     Encounter for health-related screening    -  Primary   Relevant Orders   Comprehensive metabolic panel   CBC with Differential/Platelet   Hemoglobin A1c   Major depression in remission (HCC)   (Chronic)     Relevant Medications   mirtazapine (REMERON) 30 MG tablet   Major depression, chronic       Relevant Medications   mirtazapine (REMERON) 30 MG tablet      Screening labs will be obtained Return in about 5 months (around 09/14/2022).    Shan Levans, MD

## 2022-04-15 ENCOUNTER — Other Ambulatory Visit: Payer: Self-pay

## 2022-04-18 ENCOUNTER — Other Ambulatory Visit: Payer: Self-pay

## 2022-05-16 ENCOUNTER — Other Ambulatory Visit: Payer: Self-pay

## 2022-05-19 ENCOUNTER — Other Ambulatory Visit: Payer: Self-pay

## 2022-06-16 ENCOUNTER — Other Ambulatory Visit: Payer: Self-pay

## 2022-06-17 ENCOUNTER — Other Ambulatory Visit: Payer: Self-pay

## 2022-07-18 ENCOUNTER — Other Ambulatory Visit: Payer: Self-pay

## 2022-08-14 ENCOUNTER — Other Ambulatory Visit: Payer: Self-pay

## 2022-08-15 ENCOUNTER — Other Ambulatory Visit: Payer: Self-pay

## 2022-09-12 ENCOUNTER — Other Ambulatory Visit: Payer: Self-pay

## 2022-09-15 ENCOUNTER — Ambulatory Visit: Payer: Self-pay | Attending: Internal Medicine | Admitting: Internal Medicine

## 2022-09-15 ENCOUNTER — Other Ambulatory Visit: Payer: Self-pay

## 2022-09-15 ENCOUNTER — Encounter: Payer: Self-pay | Admitting: Internal Medicine

## 2022-09-15 VITALS — BP 125/76 | HR 89 | Temp 98.4°F | Ht 65.0 in | Wt 150.0 lb

## 2022-09-15 DIAGNOSIS — F329 Major depressive disorder, single episode, unspecified: Secondary | ICD-10-CM

## 2022-09-15 DIAGNOSIS — E782 Mixed hyperlipidemia: Secondary | ICD-10-CM

## 2022-09-15 DIAGNOSIS — Z2821 Immunization not carried out because of patient refusal: Secondary | ICD-10-CM

## 2022-09-15 DIAGNOSIS — Z1231 Encounter for screening mammogram for malignant neoplasm of breast: Secondary | ICD-10-CM

## 2022-09-15 DIAGNOSIS — Z8659 Personal history of other mental and behavioral disorders: Secondary | ICD-10-CM

## 2022-09-15 MED ORDER — MIRTAZAPINE 30 MG PO TABS
30.0000 mg | ORAL_TABLET | Freq: Every day | ORAL | 6 refills | Status: AC
  Filled 2022-09-15 – 2022-10-13 (×3): qty 30, 30d supply, fill #0
  Filled 2022-11-11: qty 30, 30d supply, fill #1
  Filled 2022-12-09: qty 30, 30d supply, fill #2
  Filled 2023-01-07: qty 30, 30d supply, fill #3
  Filled 2023-02-04: qty 30, 30d supply, fill #4
  Filled 2023-03-05: qty 30, 30d supply, fill #5
  Filled 2023-04-01: qty 30, 30d supply, fill #6

## 2022-09-15 NOTE — Patient Instructions (Signed)
Please remember to return to the laboratory in a few weeks to have your blood tests done as was ordered 04/2022 visit.

## 2022-09-15 NOTE — Progress Notes (Addendum)
Patient ID: Loretta [name], female    DOB: Feb 16, 1961  MRN: [id number]  CC: Depression (Depression f/u. Med refills. /No to flu vax.)   Subjective: Loretta [name] is a 61 y.o. female who presents for chronic ds management Her concerns today include:  Pt with hx of Bipolar disorder, reactive depression, arthritis, insomnia, HL.    Depression/Bipolar: -Doing well on Remeron.  Usually is sad during the holiday season since death of her daughter but not this year as she has been working and this keeps her mind occupied. -working at [employer] part time.  Doing well.  Sleeping well   -very active b/w work and taking care of her 3 dogs and 12 cats She is using a vitamin program that she gets through the Internet call Scientist, product/process development for energy.  This involves applying a patch to both upper arms every morning and doing a vitamin shake every morning.  She tells me she is doing well with this and feels her energy level is good. HL:  due for lipid check Blood test ordered on last visit when she saw Dr. Delford Field in July of this year.  She did not come back to have those blood test done and unable to have them done today as she has to go to work.  She states she will return after the holidays to have the blood test done.  HM:  declines flu shot, COVID and RSV vaccine. Declines Shingles.  Due for MMG  Patient Active Problem List   Diagnosis Date Noted   Major depression, chronic 04/14/2022   Mixed hyperlipidemia 06/03/2020   Seasonal allergies 07/15/2017   History of hemorrhoidectomy 07/15/2017   Fatigue due to depression 10/07/2016   Bipolar disorder (HCC) 06/20/2016   GERD (gastroesophageal reflux disease) 07/24/2014     Current Outpatient Medications on File Prior to Visit  Medication Sig Dispense Refill   diclofenac Sodium (VOLTAREN) 1 % GEL Apply 4 grams topically 4 (four) times daily. 100 g 5   mirtazapine (REMERON) 30 MG tablet Take 1 tablet (30 mg total) by mouth at bedtime. 30 tablet  5   No current facility-administered medications on file prior to visit.    Allergies  Allergen Reactions   Codeine Itching, Nausea Only and Other (See Comments)    Pt feels like skin is crawling   Penicillins Itching, Nausea Only and Other (See Comments)    Pt feels like skin is crawling    Social History   Socioeconomic History   Marital status: Widowed    Spouse name: Not on file   Number of children: 5   Years of education: Not on file   Highest education level: Not on file  Occupational History   Occupation: Psychologist, prison and probation services: [employer]  Tobacco Use   Smoking status: Former    Types: Cigarettes    Quit date: 02/05/2008    Years since quitting: 14.6   Smokeless tobacco: Never   Tobacco comments:    Use marijuana  Vaping Use   Vaping Use: Never used  Substance and Sexual Activity   Alcohol use: Yes    Comment: Occassionally   Drug use: Yes    Types: Marijuana    Comment: Every day for insomnia; relax to sleep   Sexual activity: Yes    Birth control/protection: Surgical  Other Topics Concern   Not on file  Social History Narrative   Not on file   Social Determinants of Health  Financial Resource Strain: Not on file  Food Insecurity: Not on file  Transportation Needs: Not on file  Physical Activity: Inactive (12/24/2017)   Exercise Vital Sign    Days of Exercise per Week: 0 days    Minutes of Exercise per Session: 0 min  Stress: No Stress Concern Present (12/24/2017)   [employer] of Occupational Health - Occupational Stress Questionnaire    Feeling of Stress : Only a little  Social Connections: Somewhat Isolated (12/24/2017)   Social Connection and Isolation Panel [NHANES]    Frequency of Communication with Friends and Family: More than three times a week    Frequency of Social Gatherings with Friends and Family: More than three times a week    Attends Religious Services: Never    Database administrator or Organizations: No    Attends Occupational hygienist Meetings: Never    Marital Status: Living with partner  Intimate Partner Violence: Not At Risk (12/24/2017)   Humiliation, Afraid, Rape, and Kick questionnaire    Fear of Current or Ex-Partner: No    Emotionally Abused: No    Physically Abused: No    Sexually Abused: No    Family History  Problem Relation Age of Onset   Lung cancer Father    Diabetes Father    Cancer Father    Heart attack Father    Heart attack Mother    Cancer Paternal Uncle    Cancer Paternal Uncle    Colon cancer Neg Hx    Colon polyps Neg Hx    Kidney disease Neg Hx    Breast cancer Neg Hx     Past Surgical History:  Procedure Laterality Date   APPENDECTOMY     HEMORROIDECTOMY  01/21/2017   TUBAL LIGATION      ROS: Review of Systems Negative except as stated above  PHYSICAL EXAM: BP 125/76 (BP Location: Left Arm, Patient Position: Sitting, Cuff Size: Normal)   Pulse 89   Temp 98.4 F (36.9 C) (Oral)   Ht 5\' 5"  (1.651 m)   Wt 150 lb (68 kg)   SpO2 97%   BMI 24.96 kg/m   Wt Readings from Last 3 Encounters:  09/15/22 150 lb (68 kg)  04/14/22 153 lb 12.8 oz (69.8 kg)  10/10/21 146 lb (66.2 kg)    Physical Exam  General appearance - alert, well appearing, older caucasian female and in no distress Mental status - normal mood, behavior, speech, dress, motor activity, and thought processes Chest - clear to auscultation, no wheezes, rales or rhonchi, symmetric air entry Heart - normal rate, regular rhythm, normal S1, S2, no murmurs, rubs, clicks or gallops Extremities - peripheral pulses normal, no pedal edema, no clubbing or cyanosis     09/15/2022    8:39 AM 04/14/2022    2:45 PM 10/10/2021    8:53 AM  Depression screen PHQ 2/9  Decreased Interest 0 1 1  Down, Depressed, Hopeless 1 1 1   PHQ - 2 Score 1 2 2   Altered sleeping 0 1 1  Tired, decreased energy 2 1 1   Change in appetite 1 1 1   Feeling bad or failure about yourself  0 2 1  Trouble concentrating 0 1 0  Moving  slowly or fidgety/restless 0 0 0  Suicidal thoughts 0 0   PHQ-9 Score 4 8 6        Latest Ref Rng & Units 02/04/2021   10:22 AM 06/01/2020    2:24 PM 06/02/2019  2:52 PM  CMP  Glucose 65 - 99 mg/dL 87  84  85   BUN 8 - 27 mg/dL 23  16  18    Creatinine 0.57 - 1.00 mg/dL  9.77  4.14   Sodium 134 - 144 mmol/L 143  138  137   Potassium 3.5 - 5.2 mmol/L 4.7  4.7  4.8   Chloride 96 - 106 mmol/L 101  100  97   CO2 20 - 29 mmol/L 26  26  25    Calcium 8.7 - 10.3 mg/dL 2.39  9.4    Total Protein 6.0 - 8.5 g/dL 7.7  6.6  7.2   Total Bilirubin 0.0 - 1.2 mg/dL 0.3  0.2  0.3   Alkaline Phos 44 - 121 IU/L 55  49  72   AST 0 - 40 IU/L 13  19  19    ALT 0 - 32 IU/L 16  12  14     Lipid Panel     Component Value Date/Time   CHOL 219 (H) 02/04/2021 1022   TRIG 69 02/04/2021 1022   HDL 91 02/04/2021 1022   CHOLHDL 2.4 02/04/2021 1022   LDLCALC 116 (H) 02/04/2021 1022    CBC    Component Value Date/Time   WBC 6.7 02/04/2021 1022   WBC 7.0 10/07/2016 0932   RBC 4.83 02/04/2021 1022   RBC 4.87 10/07/2016 0932   HGB 14.3 02/04/2021 1022   HCT 43.0 02/04/2021 1022   PLT 253 02/04/2021 1022   MCV 89 02/04/2021 1022   MCH 29.6 02/04/2021 1022   MCH 28.1 10/07/2016 0932   MCHC 33.3 02/04/2021 1022   MCHC 32.8 10/07/2016 0932   RDW 12.7 02/04/2021 1022   LYMPHSABS 2,436 07/10/2016 1054   MONOABS 756 07/10/2016 1054   EOSABS 168 07/10/2016 1054   BASOSABS 0 07/10/2016 1054    ASSESSMENT AND PLAN:  1. Major depression, chronic 2. History of bipolar disorder Doing well on Remeron.  PHQ-9 score less today than it was in July. She will continue Remeron.  Refill sent to our pharmacy. 3. Mixed hyperlipidemia Encouraged her to return to the lab to have blood test done as was ordered in July.  4. Encounter for screening mammogram for malignant neoplasm of breast Will give mammogram scholarship today - MM Digital Screening; Future  5. Influenza vaccination declined by  patient Recommended flu shot, RSV vaccine, COVID booster and Shingrix.  Patient declines all vaccines.   Patient was given the opportunity to ask questions.  Patient verbalized understanding of the plan and was able to repeat key elements of the plan.   This documentation was completed using 09/09/2016.  Any transcriptional errors are unintentional.  Orders Placed This Encounter  Procedures   MM Digital Screening     Requested Prescriptions    No prescriptions requested or ordered in this encounter    Return in about 6 months (around 03/17/2023).  August, MD, FACP

## 2022-09-16 ENCOUNTER — Other Ambulatory Visit: Payer: Self-pay

## 2022-10-13 ENCOUNTER — Other Ambulatory Visit: Payer: Self-pay

## 2022-10-14 ENCOUNTER — Other Ambulatory Visit: Payer: Self-pay

## 2022-11-11 ENCOUNTER — Other Ambulatory Visit: Payer: Self-pay

## 2022-11-14 ENCOUNTER — Other Ambulatory Visit: Payer: Self-pay | Admitting: Internal Medicine

## 2022-11-14 ENCOUNTER — Ambulatory Visit
Admission: RE | Admit: 2022-11-14 | Discharge: 2022-11-14 | Disposition: A | Discharge status: Home / Self Care | Payer: No Typology Code available for payment source | Source: Ambulatory Visit | Attending: Internal Medicine | Admitting: Internal Medicine

## 2022-11-14 DIAGNOSIS — Z1231 Encounter for screening mammogram for malignant neoplasm of breast: Secondary | ICD-10-CM

## 2022-11-14 DIAGNOSIS — F329 Major depressive disorder, single episode, unspecified: Secondary | ICD-10-CM

## 2022-11-14 DIAGNOSIS — Z8659 Personal history of other mental and behavioral disorders: Secondary | ICD-10-CM

## 2022-11-14 DIAGNOSIS — E782 Mixed hyperlipidemia: Secondary | ICD-10-CM

## 2022-11-14 DIAGNOSIS — Z2821 Immunization not carried out because of patient refusal: Secondary | ICD-10-CM

## 2022-12-09 ENCOUNTER — Other Ambulatory Visit: Payer: Self-pay

## 2023-01-07 ENCOUNTER — Other Ambulatory Visit: Payer: Self-pay

## 2023-01-09 ENCOUNTER — Other Ambulatory Visit: Payer: Self-pay

## 2023-02-04 ENCOUNTER — Other Ambulatory Visit: Payer: Self-pay | Admitting: Critical Care Medicine

## 2023-02-04 ENCOUNTER — Other Ambulatory Visit: Payer: Self-pay

## 2023-02-04 DIAGNOSIS — M19049 Primary osteoarthritis, unspecified hand: Secondary | ICD-10-CM

## 2023-02-04 MED ORDER — DICLOFENAC SODIUM 1 % EX GEL
4.0000 g | Freq: Four times a day (QID) | CUTANEOUS | 0 refills | Status: DC
  Filled 2023-02-04: qty 400, 25d supply, fill #0

## 2023-03-06 ENCOUNTER — Other Ambulatory Visit: Payer: Self-pay

## 2023-03-17 ENCOUNTER — Ambulatory Visit: Payer: Medicaid Other | Admitting: Internal Medicine

## 2023-04-01 ENCOUNTER — Other Ambulatory Visit: Payer: Self-pay | Admitting: Family Medicine

## 2023-04-01 DIAGNOSIS — M19049 Primary osteoarthritis, unspecified hand: Secondary | ICD-10-CM

## 2023-04-01 MED ORDER — DICLOFENAC SODIUM 1 % EX GEL
4.0000 g | Freq: Four times a day (QID) | CUTANEOUS | 0 refills | Status: AC
  Filled 2023-04-01: qty 400, 25d supply, fill #0

## 2023-04-02 ENCOUNTER — Other Ambulatory Visit: Payer: Self-pay

## 2023-04-03 ENCOUNTER — Other Ambulatory Visit: Payer: Self-pay

## 2024-10-17 ENCOUNTER — Telehealth: Payer: Self-pay

## 2024-10-17 NOTE — Telephone Encounter (Signed)
 Attempted to reach patient concerning colonoscopy recall; unable to speak with patient;  left message and number to the office for patient to call back and schedule appts;
